# Patient Record
Sex: Female | Born: 1935
Health system: Southern US, Community
[De-identification: ages and names within clinical notes are randomized; demographics above are authoritative.]

## PROBLEM LIST (undated history)

## (undated) DIAGNOSIS — M109 Gout, unspecified: Secondary | ICD-10-CM

## (undated) DIAGNOSIS — I350 Nonrheumatic aortic (valve) stenosis: Secondary | ICD-10-CM

## (undated) DIAGNOSIS — I4891 Unspecified atrial fibrillation: Secondary | ICD-10-CM

## (undated) DIAGNOSIS — I10 Essential (primary) hypertension: Secondary | ICD-10-CM

## (undated) DIAGNOSIS — E079 Disorder of thyroid, unspecified: Secondary | ICD-10-CM

## (undated) DIAGNOSIS — E7800 Pure hypercholesterolemia, unspecified: Secondary | ICD-10-CM

## (undated) DIAGNOSIS — R011 Cardiac murmur, unspecified: Secondary | ICD-10-CM

## (undated) HISTORY — PX: APPENDECTOMY: SHX54

## (undated) HISTORY — DX: Nonrheumatic aortic (valve) stenosis: I35.0

---

## 2014-08-10 DIAGNOSIS — Z961 Presence of intraocular lens: Secondary | ICD-10-CM | POA: Diagnosis not present

## 2014-08-10 DIAGNOSIS — H02833 Dermatochalasis of right eye, unspecified eyelid: Secondary | ICD-10-CM | POA: Diagnosis not present

## 2014-08-10 DIAGNOSIS — H3531 Nonexudative age-related macular degeneration: Secondary | ICD-10-CM | POA: Diagnosis not present

## 2014-08-14 DIAGNOSIS — H547 Unspecified visual loss: Secondary | ICD-10-CM | POA: Diagnosis not present

## 2014-08-16 DIAGNOSIS — R030 Elevated blood-pressure reading, without diagnosis of hypertension: Secondary | ICD-10-CM | POA: Diagnosis not present

## 2014-08-16 DIAGNOSIS — M109 Gout, unspecified: Secondary | ICD-10-CM | POA: Diagnosis not present

## 2014-08-16 DIAGNOSIS — R200 Anesthesia of skin: Secondary | ICD-10-CM | POA: Diagnosis not present

## 2014-08-16 DIAGNOSIS — H3530 Unspecified macular degeneration: Secondary | ICD-10-CM | POA: Diagnosis not present

## 2014-08-16 DIAGNOSIS — M159 Polyosteoarthritis, unspecified: Secondary | ICD-10-CM | POA: Diagnosis not present

## 2014-08-16 DIAGNOSIS — N182 Chronic kidney disease, stage 2 (mild): Secondary | ICD-10-CM | POA: Diagnosis not present

## 2014-08-16 DIAGNOSIS — R609 Edema, unspecified: Secondary | ICD-10-CM | POA: Diagnosis not present

## 2014-08-16 DIAGNOSIS — H9190 Unspecified hearing loss, unspecified ear: Secondary | ICD-10-CM | POA: Diagnosis not present

## 2014-08-16 DIAGNOSIS — H547 Unspecified visual loss: Secondary | ICD-10-CM | POA: Diagnosis not present

## 2014-08-19 DIAGNOSIS — R200 Anesthesia of skin: Secondary | ICD-10-CM | POA: Diagnosis not present

## 2014-08-19 DIAGNOSIS — N182 Chronic kidney disease, stage 2 (mild): Secondary | ICD-10-CM | POA: Diagnosis not present

## 2014-08-19 DIAGNOSIS — R030 Elevated blood-pressure reading, without diagnosis of hypertension: Secondary | ICD-10-CM | POA: Diagnosis not present

## 2014-08-19 DIAGNOSIS — H547 Unspecified visual loss: Secondary | ICD-10-CM | POA: Diagnosis not present

## 2014-08-19 DIAGNOSIS — M159 Polyosteoarthritis, unspecified: Secondary | ICD-10-CM | POA: Diagnosis not present

## 2014-08-19 DIAGNOSIS — R609 Edema, unspecified: Secondary | ICD-10-CM | POA: Diagnosis not present

## 2014-08-26 DIAGNOSIS — N182 Chronic kidney disease, stage 2 (mild): Secondary | ICD-10-CM | POA: Diagnosis not present

## 2014-08-26 DIAGNOSIS — H547 Unspecified visual loss: Secondary | ICD-10-CM | POA: Diagnosis not present

## 2014-08-26 DIAGNOSIS — R609 Edema, unspecified: Secondary | ICD-10-CM | POA: Diagnosis not present

## 2014-08-26 DIAGNOSIS — M159 Polyosteoarthritis, unspecified: Secondary | ICD-10-CM | POA: Diagnosis not present

## 2014-08-26 DIAGNOSIS — R030 Elevated blood-pressure reading, without diagnosis of hypertension: Secondary | ICD-10-CM | POA: Diagnosis not present

## 2014-08-26 DIAGNOSIS — R200 Anesthesia of skin: Secondary | ICD-10-CM | POA: Diagnosis not present

## 2014-08-27 DIAGNOSIS — M159 Polyosteoarthritis, unspecified: Secondary | ICD-10-CM | POA: Diagnosis not present

## 2014-10-08 DIAGNOSIS — E785 Hyperlipidemia, unspecified: Secondary | ICD-10-CM | POA: Diagnosis not present

## 2014-10-08 DIAGNOSIS — J40 Bronchitis, not specified as acute or chronic: Secondary | ICD-10-CM | POA: Diagnosis not present

## 2014-10-08 DIAGNOSIS — R7301 Impaired fasting glucose: Secondary | ICD-10-CM | POA: Diagnosis not present

## 2014-10-08 DIAGNOSIS — I10 Essential (primary) hypertension: Secondary | ICD-10-CM | POA: Diagnosis not present

## 2014-10-20 DIAGNOSIS — R21 Rash and other nonspecific skin eruption: Secondary | ICD-10-CM | POA: Diagnosis not present

## 2014-11-07 DIAGNOSIS — I10 Essential (primary) hypertension: Secondary | ICD-10-CM | POA: Diagnosis not present

## 2014-11-07 DIAGNOSIS — J40 Bronchitis, not specified as acute or chronic: Secondary | ICD-10-CM | POA: Diagnosis not present

## 2014-11-07 DIAGNOSIS — R7301 Impaired fasting glucose: Secondary | ICD-10-CM | POA: Diagnosis not present

## 2014-11-07 DIAGNOSIS — E785 Hyperlipidemia, unspecified: Secondary | ICD-10-CM | POA: Diagnosis not present

## 2014-12-19 DIAGNOSIS — H3531 Nonexudative age-related macular degeneration: Secondary | ICD-10-CM | POA: Diagnosis not present

## 2014-12-19 DIAGNOSIS — Z961 Presence of intraocular lens: Secondary | ICD-10-CM | POA: Diagnosis not present

## 2014-12-31 DIAGNOSIS — M79605 Pain in left leg: Secondary | ICD-10-CM | POA: Diagnosis not present

## 2014-12-31 DIAGNOSIS — S81802A Unspecified open wound, left lower leg, initial encounter: Secondary | ICD-10-CM | POA: Diagnosis not present

## 2014-12-31 DIAGNOSIS — Z23 Encounter for immunization: Secondary | ICD-10-CM | POA: Diagnosis not present

## 2014-12-31 DIAGNOSIS — S81801A Unspecified open wound, right lower leg, initial encounter: Secondary | ICD-10-CM | POA: Diagnosis not present

## 2015-01-04 DIAGNOSIS — N182 Chronic kidney disease, stage 2 (mild): Secondary | ICD-10-CM | POA: Diagnosis not present

## 2015-01-04 DIAGNOSIS — S81801D Unspecified open wound, right lower leg, subsequent encounter: Secondary | ICD-10-CM | POA: Diagnosis not present

## 2015-01-04 DIAGNOSIS — R609 Edema, unspecified: Secondary | ICD-10-CM | POA: Diagnosis not present

## 2015-01-04 DIAGNOSIS — B9562 Methicillin resistant Staphylococcus aureus infection as the cause of diseases classified elsewhere: Secondary | ICD-10-CM | POA: Diagnosis not present

## 2015-01-04 DIAGNOSIS — M79605 Pain in left leg: Secondary | ICD-10-CM | POA: Diagnosis not present

## 2015-01-04 DIAGNOSIS — M109 Gout, unspecified: Secondary | ICD-10-CM | POA: Diagnosis not present

## 2015-01-04 DIAGNOSIS — S81802D Unspecified open wound, left lower leg, subsequent encounter: Secondary | ICD-10-CM | POA: Diagnosis not present

## 2015-01-07 DIAGNOSIS — M79605 Pain in left leg: Secondary | ICD-10-CM | POA: Diagnosis not present

## 2015-01-07 DIAGNOSIS — N182 Chronic kidney disease, stage 2 (mild): Secondary | ICD-10-CM | POA: Diagnosis not present

## 2015-01-07 DIAGNOSIS — S81802D Unspecified open wound, left lower leg, subsequent encounter: Secondary | ICD-10-CM | POA: Diagnosis not present

## 2015-01-07 DIAGNOSIS — B9562 Methicillin resistant Staphylococcus aureus infection as the cause of diseases classified elsewhere: Secondary | ICD-10-CM | POA: Diagnosis not present

## 2015-01-07 DIAGNOSIS — R609 Edema, unspecified: Secondary | ICD-10-CM | POA: Diagnosis not present

## 2015-01-07 DIAGNOSIS — S81801D Unspecified open wound, right lower leg, subsequent encounter: Secondary | ICD-10-CM | POA: Diagnosis not present

## 2015-01-08 DIAGNOSIS — A4902 Methicillin resistant Staphylococcus aureus infection, unspecified site: Secondary | ICD-10-CM | POA: Diagnosis not present

## 2015-01-09 DIAGNOSIS — S81801D Unspecified open wound, right lower leg, subsequent encounter: Secondary | ICD-10-CM | POA: Diagnosis not present

## 2015-01-09 DIAGNOSIS — M79605 Pain in left leg: Secondary | ICD-10-CM | POA: Diagnosis not present

## 2015-01-09 DIAGNOSIS — B9562 Methicillin resistant Staphylococcus aureus infection as the cause of diseases classified elsewhere: Secondary | ICD-10-CM | POA: Diagnosis not present

## 2015-01-09 DIAGNOSIS — R609 Edema, unspecified: Secondary | ICD-10-CM | POA: Diagnosis not present

## 2015-01-09 DIAGNOSIS — N182 Chronic kidney disease, stage 2 (mild): Secondary | ICD-10-CM | POA: Diagnosis not present

## 2015-01-09 DIAGNOSIS — S81802D Unspecified open wound, left lower leg, subsequent encounter: Secondary | ICD-10-CM | POA: Diagnosis not present

## 2015-01-12 DIAGNOSIS — R609 Edema, unspecified: Secondary | ICD-10-CM | POA: Diagnosis not present

## 2015-01-12 DIAGNOSIS — B9562 Methicillin resistant Staphylococcus aureus infection as the cause of diseases classified elsewhere: Secondary | ICD-10-CM | POA: Diagnosis not present

## 2015-01-12 DIAGNOSIS — L0390 Cellulitis, unspecified: Secondary | ICD-10-CM | POA: Diagnosis not present

## 2015-01-12 DIAGNOSIS — M79605 Pain in left leg: Secondary | ICD-10-CM | POA: Diagnosis not present

## 2015-01-12 DIAGNOSIS — S81802D Unspecified open wound, left lower leg, subsequent encounter: Secondary | ICD-10-CM | POA: Diagnosis not present

## 2015-01-12 DIAGNOSIS — S81801D Unspecified open wound, right lower leg, subsequent encounter: Secondary | ICD-10-CM | POA: Diagnosis not present

## 2015-01-12 DIAGNOSIS — N182 Chronic kidney disease, stage 2 (mild): Secondary | ICD-10-CM | POA: Diagnosis not present

## 2015-01-16 DIAGNOSIS — M79605 Pain in left leg: Secondary | ICD-10-CM | POA: Diagnosis not present

## 2015-01-16 DIAGNOSIS — R609 Edema, unspecified: Secondary | ICD-10-CM | POA: Diagnosis not present

## 2015-01-16 DIAGNOSIS — S81801D Unspecified open wound, right lower leg, subsequent encounter: Secondary | ICD-10-CM | POA: Diagnosis not present

## 2015-01-16 DIAGNOSIS — B9562 Methicillin resistant Staphylococcus aureus infection as the cause of diseases classified elsewhere: Secondary | ICD-10-CM | POA: Diagnosis not present

## 2015-01-16 DIAGNOSIS — S81802D Unspecified open wound, left lower leg, subsequent encounter: Secondary | ICD-10-CM | POA: Diagnosis not present

## 2015-01-16 DIAGNOSIS — N182 Chronic kidney disease, stage 2 (mild): Secondary | ICD-10-CM | POA: Diagnosis not present

## 2015-01-19 DIAGNOSIS — S81802D Unspecified open wound, left lower leg, subsequent encounter: Secondary | ICD-10-CM | POA: Diagnosis not present

## 2015-01-19 DIAGNOSIS — S81801D Unspecified open wound, right lower leg, subsequent encounter: Secondary | ICD-10-CM | POA: Diagnosis not present

## 2015-01-19 DIAGNOSIS — R609 Edema, unspecified: Secondary | ICD-10-CM | POA: Diagnosis not present

## 2015-01-19 DIAGNOSIS — M79605 Pain in left leg: Secondary | ICD-10-CM | POA: Diagnosis not present

## 2015-01-19 DIAGNOSIS — B9562 Methicillin resistant Staphylococcus aureus infection as the cause of diseases classified elsewhere: Secondary | ICD-10-CM | POA: Diagnosis not present

## 2015-01-19 DIAGNOSIS — N182 Chronic kidney disease, stage 2 (mild): Secondary | ICD-10-CM | POA: Diagnosis not present

## 2015-01-20 DIAGNOSIS — I4510 Unspecified right bundle-branch block: Secondary | ICD-10-CM | POA: Diagnosis not present

## 2015-01-20 DIAGNOSIS — E785 Hyperlipidemia, unspecified: Secondary | ICD-10-CM | POA: Diagnosis not present

## 2015-01-20 DIAGNOSIS — I10 Essential (primary) hypertension: Secondary | ICD-10-CM | POA: Diagnosis not present

## 2015-01-20 DIAGNOSIS — H8110 Benign paroxysmal vertigo, unspecified ear: Secondary | ICD-10-CM | POA: Diagnosis not present

## 2015-01-20 DIAGNOSIS — H81399 Other peripheral vertigo, unspecified ear: Secondary | ICD-10-CM | POA: Diagnosis not present

## 2015-01-21 DIAGNOSIS — B9562 Methicillin resistant Staphylococcus aureus infection as the cause of diseases classified elsewhere: Secondary | ICD-10-CM | POA: Diagnosis not present

## 2015-01-21 DIAGNOSIS — N182 Chronic kidney disease, stage 2 (mild): Secondary | ICD-10-CM | POA: Diagnosis not present

## 2015-01-21 DIAGNOSIS — R609 Edema, unspecified: Secondary | ICD-10-CM | POA: Diagnosis not present

## 2015-01-21 DIAGNOSIS — S81801D Unspecified open wound, right lower leg, subsequent encounter: Secondary | ICD-10-CM | POA: Diagnosis not present

## 2015-01-21 DIAGNOSIS — H8110 Benign paroxysmal vertigo, unspecified ear: Secondary | ICD-10-CM | POA: Diagnosis not present

## 2015-01-21 DIAGNOSIS — M79605 Pain in left leg: Secondary | ICD-10-CM | POA: Diagnosis not present

## 2015-01-21 DIAGNOSIS — S81802D Unspecified open wound, left lower leg, subsequent encounter: Secondary | ICD-10-CM | POA: Diagnosis not present

## 2015-01-26 DIAGNOSIS — S81801D Unspecified open wound, right lower leg, subsequent encounter: Secondary | ICD-10-CM | POA: Diagnosis not present

## 2015-01-26 DIAGNOSIS — B9562 Methicillin resistant Staphylococcus aureus infection as the cause of diseases classified elsewhere: Secondary | ICD-10-CM | POA: Diagnosis not present

## 2015-01-26 DIAGNOSIS — N182 Chronic kidney disease, stage 2 (mild): Secondary | ICD-10-CM | POA: Diagnosis not present

## 2015-01-26 DIAGNOSIS — M79605 Pain in left leg: Secondary | ICD-10-CM | POA: Diagnosis not present

## 2015-01-26 DIAGNOSIS — S81802D Unspecified open wound, left lower leg, subsequent encounter: Secondary | ICD-10-CM | POA: Diagnosis not present

## 2015-01-26 DIAGNOSIS — R609 Edema, unspecified: Secondary | ICD-10-CM | POA: Diagnosis not present

## 2015-01-27 DIAGNOSIS — S81802D Unspecified open wound, left lower leg, subsequent encounter: Secondary | ICD-10-CM | POA: Diagnosis not present

## 2015-01-27 DIAGNOSIS — B9562 Methicillin resistant Staphylococcus aureus infection as the cause of diseases classified elsewhere: Secondary | ICD-10-CM | POA: Diagnosis not present

## 2015-01-27 DIAGNOSIS — N182 Chronic kidney disease, stage 2 (mild): Secondary | ICD-10-CM | POA: Diagnosis not present

## 2015-01-27 DIAGNOSIS — S81801D Unspecified open wound, right lower leg, subsequent encounter: Secondary | ICD-10-CM | POA: Diagnosis not present

## 2015-01-27 DIAGNOSIS — M79605 Pain in left leg: Secondary | ICD-10-CM | POA: Diagnosis not present

## 2015-01-27 DIAGNOSIS — R609 Edema, unspecified: Secondary | ICD-10-CM | POA: Diagnosis not present

## 2015-01-30 DIAGNOSIS — B9562 Methicillin resistant Staphylococcus aureus infection as the cause of diseases classified elsewhere: Secondary | ICD-10-CM | POA: Diagnosis not present

## 2015-01-30 DIAGNOSIS — M79605 Pain in left leg: Secondary | ICD-10-CM | POA: Diagnosis not present

## 2015-01-30 DIAGNOSIS — S81801D Unspecified open wound, right lower leg, subsequent encounter: Secondary | ICD-10-CM | POA: Diagnosis not present

## 2015-01-30 DIAGNOSIS — S81802D Unspecified open wound, left lower leg, subsequent encounter: Secondary | ICD-10-CM | POA: Diagnosis not present

## 2015-01-30 DIAGNOSIS — R609 Edema, unspecified: Secondary | ICD-10-CM | POA: Diagnosis not present

## 2015-01-30 DIAGNOSIS — N182 Chronic kidney disease, stage 2 (mild): Secondary | ICD-10-CM | POA: Diagnosis not present

## 2015-02-11 DIAGNOSIS — S81801D Unspecified open wound, right lower leg, subsequent encounter: Secondary | ICD-10-CM | POA: Diagnosis not present

## 2015-02-11 DIAGNOSIS — B9562 Methicillin resistant Staphylococcus aureus infection as the cause of diseases classified elsewhere: Secondary | ICD-10-CM | POA: Diagnosis not present

## 2015-02-11 DIAGNOSIS — S81802D Unspecified open wound, left lower leg, subsequent encounter: Secondary | ICD-10-CM | POA: Diagnosis not present

## 2015-02-11 DIAGNOSIS — R609 Edema, unspecified: Secondary | ICD-10-CM | POA: Diagnosis not present

## 2015-02-11 DIAGNOSIS — M79605 Pain in left leg: Secondary | ICD-10-CM | POA: Diagnosis not present

## 2015-02-11 DIAGNOSIS — N182 Chronic kidney disease, stage 2 (mild): Secondary | ICD-10-CM | POA: Diagnosis not present

## 2015-03-02 DIAGNOSIS — S81802D Unspecified open wound, left lower leg, subsequent encounter: Secondary | ICD-10-CM | POA: Diagnosis not present

## 2015-03-06 DIAGNOSIS — S81802D Unspecified open wound, left lower leg, subsequent encounter: Secondary | ICD-10-CM | POA: Diagnosis not present

## 2015-04-27 DIAGNOSIS — E785 Hyperlipidemia, unspecified: Secondary | ICD-10-CM | POA: Diagnosis not present

## 2015-04-27 DIAGNOSIS — R609 Edema, unspecified: Secondary | ICD-10-CM | POA: Diagnosis not present

## 2015-04-27 DIAGNOSIS — Z23 Encounter for immunization: Secondary | ICD-10-CM | POA: Diagnosis not present

## 2015-04-27 DIAGNOSIS — Z0000 Encounter for general adult medical examination without abnormal findings: Secondary | ICD-10-CM | POA: Diagnosis not present

## 2015-04-27 DIAGNOSIS — R7301 Impaired fasting glucose: Secondary | ICD-10-CM | POA: Diagnosis not present

## 2015-04-27 DIAGNOSIS — I10 Essential (primary) hypertension: Secondary | ICD-10-CM | POA: Diagnosis not present

## 2015-04-27 DIAGNOSIS — S81801A Unspecified open wound, right lower leg, initial encounter: Secondary | ICD-10-CM | POA: Diagnosis not present

## 2015-04-27 DIAGNOSIS — M79605 Pain in left leg: Secondary | ICD-10-CM | POA: Diagnosis not present

## 2015-04-27 DIAGNOSIS — S81802A Unspecified open wound, left lower leg, initial encounter: Secondary | ICD-10-CM | POA: Diagnosis not present

## 2015-04-27 DIAGNOSIS — M109 Gout, unspecified: Secondary | ICD-10-CM | POA: Diagnosis not present

## 2015-05-25 DIAGNOSIS — F0390 Unspecified dementia without behavioral disturbance: Secondary | ICD-10-CM | POA: Diagnosis not present

## 2015-05-25 DIAGNOSIS — F329 Major depressive disorder, single episode, unspecified: Secondary | ICD-10-CM | POA: Diagnosis not present

## 2015-05-25 DIAGNOSIS — G3184 Mild cognitive impairment, so stated: Secondary | ICD-10-CM | POA: Diagnosis not present

## 2015-05-25 DIAGNOSIS — R400 Somnolence: Secondary | ICD-10-CM | POA: Diagnosis not present

## 2015-05-25 DIAGNOSIS — F430 Acute stress reaction: Secondary | ICD-10-CM | POA: Diagnosis not present

## 2015-05-25 DIAGNOSIS — E785 Hyperlipidemia, unspecified: Secondary | ICD-10-CM | POA: Diagnosis not present

## 2015-05-25 DIAGNOSIS — N390 Urinary tract infection, site not specified: Secondary | ICD-10-CM | POA: Diagnosis not present

## 2015-05-25 DIAGNOSIS — Z66 Do not resuscitate: Secondary | ICD-10-CM | POA: Diagnosis not present

## 2015-05-25 DIAGNOSIS — I10 Essential (primary) hypertension: Secondary | ICD-10-CM | POA: Diagnosis not present

## 2015-05-25 DIAGNOSIS — R4182 Altered mental status, unspecified: Secondary | ICD-10-CM | POA: Diagnosis not present

## 2015-05-25 DIAGNOSIS — R011 Cardiac murmur, unspecified: Secondary | ICD-10-CM | POA: Diagnosis not present

## 2015-05-25 DIAGNOSIS — E876 Hypokalemia: Secondary | ICD-10-CM | POA: Diagnosis not present

## 2015-06-08 DIAGNOSIS — L578 Other skin changes due to chronic exposure to nonionizing radiation: Secondary | ICD-10-CM | POA: Diagnosis not present

## 2015-06-08 DIAGNOSIS — C44712 Basal cell carcinoma of skin of right lower limb, including hip: Secondary | ICD-10-CM | POA: Diagnosis not present

## 2015-06-08 DIAGNOSIS — L570 Actinic keratosis: Secondary | ICD-10-CM | POA: Diagnosis not present

## 2015-06-08 DIAGNOSIS — D0471 Carcinoma in situ of skin of right lower limb, including hip: Secondary | ICD-10-CM | POA: Diagnosis not present

## 2015-06-08 DIAGNOSIS — C44719 Basal cell carcinoma of skin of left lower limb, including hip: Secondary | ICD-10-CM | POA: Diagnosis not present

## 2015-06-08 DIAGNOSIS — D485 Neoplasm of uncertain behavior of skin: Secondary | ICD-10-CM | POA: Diagnosis not present

## 2015-06-08 DIAGNOSIS — Z85828 Personal history of other malignant neoplasm of skin: Secondary | ICD-10-CM | POA: Diagnosis not present

## 2015-06-09 DIAGNOSIS — C44719 Basal cell carcinoma of skin of left lower limb, including hip: Secondary | ICD-10-CM | POA: Diagnosis not present

## 2015-06-09 DIAGNOSIS — L570 Actinic keratosis: Secondary | ICD-10-CM | POA: Diagnosis not present

## 2015-06-09 DIAGNOSIS — D0471 Carcinoma in situ of skin of right lower limb, including hip: Secondary | ICD-10-CM | POA: Diagnosis not present

## 2015-06-09 DIAGNOSIS — C44712 Basal cell carcinoma of skin of right lower limb, including hip: Secondary | ICD-10-CM | POA: Diagnosis not present

## 2015-06-23 DIAGNOSIS — Z85828 Personal history of other malignant neoplasm of skin: Secondary | ICD-10-CM | POA: Diagnosis not present

## 2015-06-23 DIAGNOSIS — L570 Actinic keratosis: Secondary | ICD-10-CM | POA: Diagnosis not present

## 2015-06-23 DIAGNOSIS — D0471 Carcinoma in situ of skin of right lower limb, including hip: Secondary | ICD-10-CM | POA: Diagnosis not present

## 2015-06-23 DIAGNOSIS — C44719 Basal cell carcinoma of skin of left lower limb, including hip: Secondary | ICD-10-CM | POA: Diagnosis not present

## 2015-06-23 DIAGNOSIS — C44712 Basal cell carcinoma of skin of right lower limb, including hip: Secondary | ICD-10-CM | POA: Diagnosis not present

## 2015-07-14 DIAGNOSIS — F329 Major depressive disorder, single episode, unspecified: Secondary | ICD-10-CM | POA: Diagnosis not present

## 2015-07-14 DIAGNOSIS — C439 Malignant melanoma of skin, unspecified: Secondary | ICD-10-CM | POA: Diagnosis not present

## 2015-07-14 DIAGNOSIS — I10 Essential (primary) hypertension: Secondary | ICD-10-CM | POA: Diagnosis not present

## 2015-07-14 DIAGNOSIS — E785 Hyperlipidemia, unspecified: Secondary | ICD-10-CM | POA: Diagnosis not present

## 2015-07-16 DIAGNOSIS — I10 Essential (primary) hypertension: Secondary | ICD-10-CM | POA: Diagnosis not present

## 2015-07-16 DIAGNOSIS — E785 Hyperlipidemia, unspecified: Secondary | ICD-10-CM | POA: Diagnosis not present

## 2015-07-16 DIAGNOSIS — E559 Vitamin D deficiency, unspecified: Secondary | ICD-10-CM | POA: Diagnosis not present

## 2015-07-28 DIAGNOSIS — E559 Vitamin D deficiency, unspecified: Secondary | ICD-10-CM | POA: Diagnosis not present

## 2015-07-28 DIAGNOSIS — F329 Major depressive disorder, single episode, unspecified: Secondary | ICD-10-CM | POA: Diagnosis not present

## 2015-07-28 DIAGNOSIS — D649 Anemia, unspecified: Secondary | ICD-10-CM | POA: Diagnosis not present

## 2015-07-28 DIAGNOSIS — E785 Hyperlipidemia, unspecified: Secondary | ICD-10-CM | POA: Diagnosis not present

## 2015-08-25 DIAGNOSIS — L570 Actinic keratosis: Secondary | ICD-10-CM | POA: Diagnosis not present

## 2015-08-25 DIAGNOSIS — X32XXXA Exposure to sunlight, initial encounter: Secondary | ICD-10-CM | POA: Diagnosis not present

## 2015-08-25 DIAGNOSIS — L0100 Impetigo, unspecified: Secondary | ICD-10-CM | POA: Diagnosis not present

## 2015-08-25 DIAGNOSIS — D225 Melanocytic nevi of trunk: Secondary | ICD-10-CM | POA: Diagnosis not present

## 2015-09-04 DIAGNOSIS — H353120 Nonexudative age-related macular degeneration, left eye, stage unspecified: Secondary | ICD-10-CM | POA: Diagnosis not present

## 2015-09-04 DIAGNOSIS — H353110 Nonexudative age-related macular degeneration, right eye, stage unspecified: Secondary | ICD-10-CM | POA: Diagnosis not present

## 2015-09-04 DIAGNOSIS — H34211 Partial retinal artery occlusion, right eye: Secondary | ICD-10-CM | POA: Diagnosis not present

## 2015-09-04 DIAGNOSIS — Z961 Presence of intraocular lens: Secondary | ICD-10-CM | POA: Diagnosis not present

## 2015-09-08 DIAGNOSIS — E039 Hypothyroidism, unspecified: Secondary | ICD-10-CM | POA: Diagnosis not present

## 2015-09-08 DIAGNOSIS — D0339 Melanoma in situ of other parts of face: Secondary | ICD-10-CM | POA: Diagnosis not present

## 2015-09-08 DIAGNOSIS — K5900 Constipation, unspecified: Secondary | ICD-10-CM | POA: Diagnosis not present

## 2015-09-09 DIAGNOSIS — L0100 Impetigo, unspecified: Secondary | ICD-10-CM | POA: Diagnosis not present

## 2015-09-09 DIAGNOSIS — X32XXXD Exposure to sunlight, subsequent encounter: Secondary | ICD-10-CM | POA: Diagnosis not present

## 2015-09-09 DIAGNOSIS — L570 Actinic keratosis: Secondary | ICD-10-CM | POA: Diagnosis not present

## 2015-09-30 DIAGNOSIS — L570 Actinic keratosis: Secondary | ICD-10-CM | POA: Diagnosis not present

## 2015-09-30 DIAGNOSIS — X32XXXD Exposure to sunlight, subsequent encounter: Secondary | ICD-10-CM | POA: Diagnosis not present

## 2015-09-30 DIAGNOSIS — C44319 Basal cell carcinoma of skin of other parts of face: Secondary | ICD-10-CM | POA: Diagnosis not present

## 2015-10-22 DIAGNOSIS — E039 Hypothyroidism, unspecified: Secondary | ICD-10-CM | POA: Diagnosis not present

## 2015-10-22 DIAGNOSIS — I10 Essential (primary) hypertension: Secondary | ICD-10-CM | POA: Diagnosis not present

## 2015-10-22 DIAGNOSIS — H6123 Impacted cerumen, bilateral: Secondary | ICD-10-CM | POA: Diagnosis not present

## 2015-12-22 DIAGNOSIS — X32XXXD Exposure to sunlight, subsequent encounter: Secondary | ICD-10-CM | POA: Diagnosis not present

## 2015-12-22 DIAGNOSIS — L570 Actinic keratosis: Secondary | ICD-10-CM | POA: Diagnosis not present

## 2015-12-22 DIAGNOSIS — Z85828 Personal history of other malignant neoplasm of skin: Secondary | ICD-10-CM | POA: Diagnosis not present

## 2015-12-22 DIAGNOSIS — C44319 Basal cell carcinoma of skin of other parts of face: Secondary | ICD-10-CM | POA: Diagnosis not present

## 2015-12-22 DIAGNOSIS — Z08 Encounter for follow-up examination after completed treatment for malignant neoplasm: Secondary | ICD-10-CM | POA: Diagnosis not present

## 2016-01-22 DIAGNOSIS — E039 Hypothyroidism, unspecified: Secondary | ICD-10-CM | POA: Diagnosis not present

## 2016-01-22 DIAGNOSIS — E559 Vitamin D deficiency, unspecified: Secondary | ICD-10-CM | POA: Diagnosis not present

## 2016-01-22 DIAGNOSIS — Z0000 Encounter for general adult medical examination without abnormal findings: Secondary | ICD-10-CM | POA: Diagnosis not present

## 2016-01-22 DIAGNOSIS — I10 Essential (primary) hypertension: Secondary | ICD-10-CM | POA: Diagnosis not present

## 2016-01-22 DIAGNOSIS — D649 Anemia, unspecified: Secondary | ICD-10-CM | POA: Diagnosis not present

## 2016-02-02 DIAGNOSIS — E559 Vitamin D deficiency, unspecified: Secondary | ICD-10-CM | POA: Diagnosis not present

## 2016-02-02 DIAGNOSIS — I10 Essential (primary) hypertension: Secondary | ICD-10-CM | POA: Diagnosis not present

## 2016-02-02 DIAGNOSIS — H6123 Impacted cerumen, bilateral: Secondary | ICD-10-CM | POA: Diagnosis not present

## 2016-02-02 DIAGNOSIS — D649 Anemia, unspecified: Secondary | ICD-10-CM | POA: Diagnosis not present

## 2016-02-02 DIAGNOSIS — E039 Hypothyroidism, unspecified: Secondary | ICD-10-CM | POA: Diagnosis not present

## 2016-02-02 DIAGNOSIS — N390 Urinary tract infection, site not specified: Secondary | ICD-10-CM | POA: Diagnosis not present

## 2016-02-02 DIAGNOSIS — K559 Vascular disorder of intestine, unspecified: Secondary | ICD-10-CM | POA: Diagnosis not present

## 2016-02-11 DIAGNOSIS — R197 Diarrhea, unspecified: Secondary | ICD-10-CM | POA: Diagnosis not present

## 2016-03-09 DIAGNOSIS — I10 Essential (primary) hypertension: Secondary | ICD-10-CM | POA: Diagnosis not present

## 2016-03-09 DIAGNOSIS — R0989 Other specified symptoms and signs involving the circulatory and respiratory systems: Secondary | ICD-10-CM | POA: Diagnosis not present

## 2016-03-09 DIAGNOSIS — I350 Nonrheumatic aortic (valve) stenosis: Secondary | ICD-10-CM | POA: Diagnosis not present

## 2016-03-09 DIAGNOSIS — E7800 Pure hypercholesterolemia, unspecified: Secondary | ICD-10-CM | POA: Diagnosis not present

## 2016-03-15 DIAGNOSIS — I10 Essential (primary) hypertension: Secondary | ICD-10-CM | POA: Diagnosis not present

## 2016-03-15 DIAGNOSIS — E2839 Other primary ovarian failure: Secondary | ICD-10-CM | POA: Diagnosis not present

## 2016-03-15 DIAGNOSIS — E039 Hypothyroidism, unspecified: Secondary | ICD-10-CM | POA: Diagnosis not present

## 2016-03-15 DIAGNOSIS — D649 Anemia, unspecified: Secondary | ICD-10-CM | POA: Diagnosis not present

## 2016-08-12 DIAGNOSIS — I10 Essential (primary) hypertension: Secondary | ICD-10-CM | POA: Diagnosis not present

## 2016-08-12 DIAGNOSIS — D649 Anemia, unspecified: Secondary | ICD-10-CM | POA: Diagnosis not present

## 2016-08-12 DIAGNOSIS — E538 Deficiency of other specified B group vitamins: Secondary | ICD-10-CM | POA: Diagnosis not present

## 2016-08-12 DIAGNOSIS — D509 Iron deficiency anemia, unspecified: Secondary | ICD-10-CM | POA: Diagnosis not present

## 2016-08-12 DIAGNOSIS — Z79899 Other long term (current) drug therapy: Secondary | ICD-10-CM | POA: Diagnosis not present

## 2016-08-15 DIAGNOSIS — W19XXXA Unspecified fall, initial encounter: Secondary | ICD-10-CM | POA: Diagnosis not present

## 2016-08-15 DIAGNOSIS — S0181XA Laceration without foreign body of other part of head, initial encounter: Secondary | ICD-10-CM | POA: Diagnosis not present

## 2016-08-15 DIAGNOSIS — I509 Heart failure, unspecified: Secondary | ICD-10-CM | POA: Diagnosis not present

## 2016-08-22 DIAGNOSIS — K219 Gastro-esophageal reflux disease without esophagitis: Secondary | ICD-10-CM | POA: Diagnosis not present

## 2016-08-22 DIAGNOSIS — Z0000 Encounter for general adult medical examination without abnormal findings: Secondary | ICD-10-CM | POA: Diagnosis not present

## 2016-08-22 DIAGNOSIS — I10 Essential (primary) hypertension: Secondary | ICD-10-CM | POA: Diagnosis not present

## 2016-08-22 DIAGNOSIS — E7800 Pure hypercholesterolemia, unspecified: Secondary | ICD-10-CM | POA: Diagnosis not present

## 2016-08-22 DIAGNOSIS — E039 Hypothyroidism, unspecified: Secondary | ICD-10-CM | POA: Diagnosis not present

## 2016-09-16 DIAGNOSIS — C44622 Squamous cell carcinoma of skin of right upper limb, including shoulder: Secondary | ICD-10-CM | POA: Diagnosis not present

## 2016-10-21 DIAGNOSIS — Z08 Encounter for follow-up examination after completed treatment for malignant neoplasm: Secondary | ICD-10-CM | POA: Diagnosis not present

## 2016-10-21 DIAGNOSIS — Z85828 Personal history of other malignant neoplasm of skin: Secondary | ICD-10-CM | POA: Diagnosis not present

## 2017-01-17 DIAGNOSIS — R05 Cough: Secondary | ICD-10-CM | POA: Diagnosis not present

## 2017-01-17 DIAGNOSIS — E7800 Pure hypercholesterolemia, unspecified: Secondary | ICD-10-CM | POA: Diagnosis not present

## 2017-01-17 DIAGNOSIS — I10 Essential (primary) hypertension: Secondary | ICD-10-CM | POA: Diagnosis not present

## 2017-02-24 DIAGNOSIS — E7800 Pure hypercholesterolemia, unspecified: Secondary | ICD-10-CM | POA: Diagnosis not present

## 2017-03-23 DIAGNOSIS — I10 Essential (primary) hypertension: Secondary | ICD-10-CM | POA: Diagnosis not present

## 2017-03-23 DIAGNOSIS — E7800 Pure hypercholesterolemia, unspecified: Secondary | ICD-10-CM | POA: Diagnosis not present

## 2017-03-23 DIAGNOSIS — E039 Hypothyroidism, unspecified: Secondary | ICD-10-CM | POA: Diagnosis not present

## 2017-03-23 DIAGNOSIS — R011 Cardiac murmur, unspecified: Secondary | ICD-10-CM | POA: Diagnosis not present

## 2017-05-05 ENCOUNTER — Other Ambulatory Visit: Payer: Self-pay

## 2017-05-05 ENCOUNTER — Emergency Department (HOSPITAL_COMMUNITY): Payer: Medicare Other

## 2017-05-05 ENCOUNTER — Encounter (HOSPITAL_COMMUNITY): Payer: Self-pay

## 2017-05-05 ENCOUNTER — Emergency Department (HOSPITAL_COMMUNITY)
Admission: EM | Admit: 2017-05-05 | Discharge: 2017-05-06 | Disposition: A | Discharge status: Home / Self Care | Payer: Medicare Other | Attending: Emergency Medicine | Admitting: Emergency Medicine

## 2017-05-05 DIAGNOSIS — R1013 Epigastric pain: Secondary | ICD-10-CM | POA: Insufficient documentation

## 2017-05-05 DIAGNOSIS — R1111 Vomiting without nausea: Secondary | ICD-10-CM | POA: Diagnosis not present

## 2017-05-05 DIAGNOSIS — R109 Unspecified abdominal pain: Secondary | ICD-10-CM

## 2017-05-05 DIAGNOSIS — R079 Chest pain, unspecified: Secondary | ICD-10-CM | POA: Insufficient documentation

## 2017-05-05 DIAGNOSIS — D649 Anemia, unspecified: Secondary | ICD-10-CM | POA: Diagnosis not present

## 2017-05-05 DIAGNOSIS — R112 Nausea with vomiting, unspecified: Secondary | ICD-10-CM | POA: Diagnosis not present

## 2017-05-05 DIAGNOSIS — I739 Peripheral vascular disease, unspecified: Secondary | ICD-10-CM

## 2017-05-05 DIAGNOSIS — D539 Nutritional anemia, unspecified: Secondary | ICD-10-CM | POA: Diagnosis not present

## 2017-05-05 DIAGNOSIS — K625 Hemorrhage of anus and rectum: Secondary | ICD-10-CM | POA: Diagnosis not present

## 2017-05-05 DIAGNOSIS — N390 Urinary tract infection, site not specified: Secondary | ICD-10-CM | POA: Insufficient documentation

## 2017-05-05 DIAGNOSIS — F0390 Unspecified dementia without behavioral disturbance: Secondary | ICD-10-CM | POA: Insufficient documentation

## 2017-05-05 DIAGNOSIS — J9811 Atelectasis: Secondary | ICD-10-CM | POA: Diagnosis not present

## 2017-05-05 HISTORY — DX: Disorder of thyroid, unspecified: E07.9

## 2017-05-05 HISTORY — DX: Gout, unspecified: M10.9

## 2017-05-05 HISTORY — DX: Essential (primary) hypertension: I10

## 2017-05-05 HISTORY — DX: Pure hypercholesterolemia, unspecified: E78.00

## 2017-05-05 LAB — I-STAT CHEM 8, ED
BUN: 25 mg/dL — AB (ref 6–20)
CALCIUM ION: 1.07 mmol/L — AB (ref 1.15–1.40)
CREATININE: 1.2 mg/dL — AB (ref 0.44–1.00)
Chloride: 99 mmol/L — ABNORMAL LOW (ref 101–111)
GLUCOSE: 119 mg/dL — AB (ref 65–99)
HCT: 34 % — ABNORMAL LOW (ref 36.0–46.0)
Hemoglobin: 11.6 g/dL — ABNORMAL LOW (ref 12.0–15.0)
Potassium: 3.8 mmol/L (ref 3.5–5.1)
Sodium: 139 mmol/L (ref 135–145)
TCO2: 30 mmol/L (ref 22–32)

## 2017-05-05 LAB — COMPREHENSIVE METABOLIC PANEL
ALBUMIN: 3.6 g/dL (ref 3.5–5.0)
ALK PHOS: 112 U/L (ref 38–126)
ALT: 31 U/L (ref 14–54)
ANION GAP: 12 (ref 5–15)
AST: 72 U/L — AB (ref 15–41)
BILIRUBIN TOTAL: 0.7 mg/dL (ref 0.3–1.2)
BUN: 20 mg/dL (ref 6–20)
CALCIUM: 8.8 mg/dL — AB (ref 8.9–10.3)
CO2: 28 mmol/L (ref 22–32)
Chloride: 98 mmol/L — ABNORMAL LOW (ref 101–111)
Creatinine, Ser: 1.2 mg/dL — ABNORMAL HIGH (ref 0.44–1.00)
GFR calc Af Amer: 44 mL/min — ABNORMAL LOW (ref >60)
GFR, EST NON AFRICAN AMERICAN: 38 mL/min — AB (ref >60)
GLUCOSE: 123 mg/dL — AB (ref 65–99)
Potassium: 3.8 mmol/L (ref 3.5–5.1)
Sodium: 138 mmol/L (ref 135–145)
TOTAL PROTEIN: 6.2 g/dL — AB (ref 6.5–8.1)

## 2017-05-05 LAB — I-STAT CG4 LACTIC ACID, ED: Lactic Acid, Venous: 1.75 mmol/L (ref 0.5–1.9)

## 2017-05-05 LAB — CBC WITH DIFFERENTIAL/PLATELET
BASOS PCT: 0 %
Basophils Absolute: 0 10*3/uL (ref 0.0–0.1)
Eosinophils Absolute: 0.1 10*3/uL (ref 0.0–0.7)
Eosinophils Relative: 1 %
HEMATOCRIT: 35.4 % — AB (ref 36.0–46.0)
HEMOGLOBIN: 11.4 g/dL — AB (ref 12.0–15.0)
LYMPHS PCT: 6 %
Lymphs Abs: 0.4 10*3/uL — ABNORMAL LOW (ref 0.7–4.0)
MCH: 30.1 pg (ref 26.0–34.0)
MCHC: 32.2 g/dL (ref 30.0–36.0)
MCV: 93.4 fL (ref 78.0–100.0)
MONOS PCT: 2 %
Monocytes Absolute: 0.1 10*3/uL (ref 0.1–1.0)
NEUTROS ABS: 6.5 10*3/uL (ref 1.7–7.7)
NEUTROS PCT: 91 %
Platelets: 198 10*3/uL (ref 150–400)
RBC: 3.79 MIL/uL — ABNORMAL LOW (ref 3.87–5.11)
RDW: 14.1 % (ref 11.5–15.5)
WBC: 7.1 10*3/uL (ref 4.0–10.5)

## 2017-05-05 LAB — I-STAT TROPONIN, ED: TROPONIN I, POC: 0.02 ng/mL (ref 0.00–0.08)

## 2017-05-05 LAB — LIPASE, BLOOD: LIPASE: 38 U/L (ref 11–51)

## 2017-05-05 MED ORDER — IOPAMIDOL (ISOVUE-370) INJECTION 76%
INTRAVENOUS | Status: AC
  Administered 2017-05-05: 100 mL
  Filled 2017-05-05: qty 100

## 2017-05-05 NOTE — ED Notes (Signed)
Delay in lab draw pt still in ct

## 2017-05-05 NOTE — ED Notes (Signed)
Delay in lab draw,  Pt not in room 

## 2017-05-05 NOTE — ED Notes (Signed)
Dr Roxanne Mins in room.

## 2017-05-05 NOTE — ED Triage Notes (Signed)
Per EMS, pt from home initially called out for n/v and complains of epigastric pain down past the naval. Daughter states that pt had bright red blood in stool. Unable to palpate pulse in left arm, moderate pulse in right arm. BP in left arm initially was 898 systolic and right arm was 421 systolic, then unable to get a BP on the left side again. 12 leads show sinus tach with RBBB. Pt being more altered upon arrival here.

## 2017-05-05 NOTE — ED Provider Notes (Signed)
[facility] EMERGENCY DEPARTMENT Provider Note   CSN: [id number] Arrival date & time: 05/05/17  2242     History   Chief Complaint Chief Complaint  Patient presents with  . Abdominal Pain  . Emesis  . Pulse pressure difference    HPI Shirley [name] is a 83 y.o. female.  The history is provided by the EMS personnel and the patient. The history is limited by the condition of the patient (Dementia).  She is reported to have complained about a sharp pain in her epigastric area which went to her back.  This was followed by some vomiting.  She states that she feels fine now.  She does remember vomiting but does not remember complaining about pain.  Her other complaint currently is that her mouth is dry.  Of note, EMS noted decreased pulses on the left and significant difference in blood pressure between right and left arm.  No past medical history on file.  There are no active problems to display for this patient.   Past Surgical History:  Procedure Laterality Date  . APPENDECTOMY      OB History    No data available       Home Medications    Prior to Admission medications   Not on File    Family History No family history on file.  Social History Social History   Tobacco Use  . Smoking status: Not on file  Substance Use Topics  . Alcohol use: Not on file  . Drug use: Not on file     Allergies   Patient has no allergy information on record.   Review of Systems Review of Systems  Unable to perform ROS: Dementia     Physical Exam Updated Vital Signs Pulse 96   Temp (!) 101 F (38.3 C) (Oral)   Resp 18   Ht 5\' 4"  (1.626 m)   Wt 68 kg (150 lb)   SpO2 96%   BMI 25.75 kg/m   Physical Exam  Nursing note and vitals reviewed.  83 year old female, resting comfortably and in no acute distress. Vital signs are significant for fever. Oxygen saturation is 96%, which is normal. Head is normocephalic and atraumatic. PERRLA, EOMI.  Oropharynx is clear.  Mucous membranes are dry. Neck is nontender and supple without adenopathy or JVD. Back is nontender and there is no CVA tenderness. Lungs are clear without rales, wheezes, or rhonchi. Chest is nontender. Heart has regular rate and rhythm with 3/6 systolic ejection murmur heard throughout the precordium but loudest at the cardiac base with some radiation to the neck. Abdomen is soft, flat, nontender without masses or hepatosplenomegaly and peristalsis is normoactive. Extremities have no cyanosis or edema, full range of motion is present.  Right radial and dorsalis pedis pulses are easily palpable, left radial pulse and left dorsalis pedis pulses are not palpable although the extremities are warm and with prompt capillary refill. Skin is warm and dry without rash. Neurologic: Awake and alert and oriented to person and place but not time, cranial nerves are intact, there are no motor or sensory deficits.  ED Treatments / Results  Labs (all labs ordered are listed, but only abnormal results are displayed) Labs Reviewed  CBC WITH DIFFERENTIAL/PLATELET - Abnormal; Notable for the following components:      Result Value   RBC 3.79 (*)    Hemoglobin 11.4 (*)    HCT 35.4 (*)    Lymphs Abs 0.4 (*)  All other components within normal limits  COMPREHENSIVE METABOLIC PANEL - Abnormal; Notable for the following components:   Chloride 98 (*)    Glucose, Bld 123 (*)    Creatinine, Ser 1.20 (*)    Calcium 8.8 (*)    Total Protein 6.2 (*)    AST 72 (*)    GFR calc non Af Amer 38 (*)    GFR calc Af Amer 44 (*)    All other components within normal limits  URINALYSIS, ROUTINE W REFLEX MICROSCOPIC - Abnormal; Notable for the following components:   APPearance HAZY (*)    Hgb urine dipstick SMALL (*)    Nitrite POSITIVE (*)    Leukocytes, UA MODERATE (*)    Bacteria, UA MANY (*)    Squamous Epithelial / LPF 0-5 (*)    All other components within normal limits  I-STAT CHEM 8,  ED - Abnormal; Notable for the following components:   Chloride 99 (*)    BUN 25 (*)    Creatinine, Ser 1.20 (*)    Glucose, Bld 119 (*)    Calcium, Ion 1.07 (*)    Hemoglobin 11.6 (*)    HCT 34.0 (*)    All other components within normal limits  CULTURE, BLOOD (ROUTINE X 2)  CULTURE, BLOOD (ROUTINE X 2)  URINE CULTURE  LIPASE, BLOOD  I-STAT TROPONIN, ED  I-STAT CG4 LACTIC ACID, ED    EKG  EKG Interpretation  Date/Time:  Friday May 05 2017 22:45:51 EST Ventricular Rate:  97 PR Interval:    QRS Duration: 146 QT Interval:  344 QTC Calculation: 437 R Axis:   41 Text Interpretation:  Sinus or ectopic atrial tachycardia Multiple ventricular premature complexes Prolonged PR interval Right bundle branch block Anteroseptal infarct, age indeterminate Lateral leads are also involved No old tracing to compare Confirmed by Delora Fuel ([id number]) on 05/05/2017 10:50:10 PM       Radiology Dg Chest 2 View  Result Date: 05/05/2017 CLINICAL DATA:  Epigastric pain. EXAM: CHEST  2 VIEW COMPARISON:  Chest CT May 05, 2017 FINDINGS: The heart, hila, mediastinum lungs, and pleura are unremarkable. Mild atelectasis in the lingula. IMPRESSION: No active cardiopulmonary disease. Electronically Signed   By: Dorise Bullion III M.D   On: 05/05/2017 23:58   Ct Angio Chest/abd/pel For Dissection W And/or Wo Contrast  Result Date: 05/06/2017 CLINICAL DATA:  83 y/o F; 83 y/o F; bright red blood per rectum. Abdominal pain. EXAM: CT ANGIOGRAPHY CHEST, ABDOMEN AND PELVIS TECHNIQUE: Multidetector CT imaging through the chest, abdomen and pelvis was performed using the standard protocol during bolus administration of intravenous contrast. Multiplanar reconstructed images and MIPs were obtained and reviewed to evaluate the vascular anatomy. CONTRAST:  184mL ISOVUE-370 IOPAMIDOL (ISOVUE-370) INJECTION 76% COMPARISON:  None. FINDINGS: CTA CHEST FINDINGS Cardiovascular: Normal heart size. No pericardial  effusion. Severe coronary, aortic valvular, and mitral annular calcification. Normal caliber thoracic aorta. Severe aortic calcific atherosclerosis. Severe stenosis of left subclavian artery origin secondary to dense calcified plaque. Mediastinum/Nodes: No enlarged mediastinal, hilar, or axillary lymph nodes. Thyroid gland, trachea, and esophagus demonstrate no significant findings. Moderate hiatal hernia. Lungs/Pleura: Smooth interlobular septal thickening. Tiny clustered nodules of the periphery of the lower lobes bilaterally. No consolidation, effusion, or pneumothorax. Musculoskeletal: No chest wall abnormality. No acute or significant osseous findings. Review of the MIP images confirms the above findings. CTA ABDOMEN AND PELVIS FINDINGS VASCULAR Aorta: Normal caliber aorta without aneurysm, dissection, vasculitis or significant stenosis. Severe calcific atherosclerosis. Celiac: Mild less  than 50% stenosis of the origin. SMA: Mild to moderate 50% stenosis of the origin. Renals: Both renal arteries are patent without evidence of aneurysm, dissection, vasculitis, fibromuscular dysplasia or significant stenosis. IMA: Patent without evidence of aneurysm, dissection, vasculitis or significant stenosis. Inflow: Left common iliac artery focal dissection/ penetrating ulcer (series 6, image 196) without significant stenosis. Otherwise unremarkable. Veins: No obvious venous abnormality within the limitations of this arterial phase study. Review of the MIP images confirms the above findings. NON-VASCULAR Hepatobiliary: No focal liver abnormality is seen. No gallstones, gallbladder wall thickening, or biliary dilatation. Pancreas: Unremarkable. No pancreatic ductal dilatation or surrounding inflammatory changes. Spleen: Normal in size without focal abnormality. Adrenals/Urinary Tract: Adrenal glands are unremarkable. Kidneys are normal, without renal calculi, focal lesion, or hydronephrosis. Bladder is unremarkable.  Stomach/Bowel: Stomach is within normal limits. Appendix appears normal. No evidence of bowel wall thickening, distention, or inflammatory changes. Lymphatic: No significant vascular findings are present. No enlarged abdominal or pelvic lymph nodes. Reproductive: Uterus and bilateral adnexa are unremarkable. Other: No abdominal wall hernia or abnormality. No abdominopelvic ascites. Musculoskeletal: Moderate multilevel degenerative changes of the spine greatest at the L1-2 and T9-10 levels. No acute fracture. Review of the MIP images confirms the above findings. IMPRESSION: 1. No aortic dissection, aneurysm, or findings of vasculitis. 2. Left common iliac artery focal dissection/penetrating ulcer without significant stenosis. 3. Severe aortic, coronary artery, aortic valvular, and mitral annular calcifications. 4. Left subclavian artery origin severe stenosis secondary to dense calcified plaque. 5. Mild-moderate 50% SMA origin stenosis. 6. Mild less than 50% celiac axis origin stenosis. 7. Moderate hiatal hernia. 8. Mild interstitial edema. 9. Minimal bronchiolitis at the lung bases. Electronically Signed   By: Kristine Garbe M.D.   On: 05/06/2017 00:20    Procedures Procedures (including critical care time) CRITICAL CARE Performed by: Delora Fuel Total critical care time: 40 minutes Critical care time was exclusive of separately billable procedures and treating other patients. Critical care was necessary to treat or prevent imminent or life-threatening deterioration. Critical care was time spent personally by me on the following activities: development of treatment plan with patient and/or surrogate as well as nursing, discussions with consultants, evaluation of patient's response to treatment, examination of patient, obtaining history from patient or surrogate, ordering and performing treatments and interventions, ordering and review of laboratory studies, ordering and review of radiographic  studies, pulse oximetry and re-evaluation of patient's condition.  Medications Ordered in ED Medications  iopamidol (ISOVUE-370) 76 % injection (100 mLs  Contrast Given 05/05/17 2328)  sodium chloride 0.9 % bolus 500 mL (0 mLs Intravenous Stopped 05/06/17 0124)  cefTRIAXone (ROCEPHIN) 1 g in dextrose 5 % 50 mL IVPB (1 g Intravenous New Bag/Given 05/06/17 0242)     Initial Impression / Assessment and Plan / ED Course  I have reviewed the triage vital signs and the nursing notes.  Pertinent labs & imaging results that were available during my care of the patient were reviewed by me and considered in my medical decision making (see chart for details).  Transient abdominal pain and vomiting with unequal pulses.  Blood pressure difference between right arm and left arm is approximately 50 mm.  This is very worrisome for aortic dissection and she will be sent for CT angiogram of chest, abdomen, pelvis looking for aortic dissection.  Fever is also present and she will need to be evaluated for occult sepsis.  Lactic acid has been ordered which would show evidence of sepsis but also evidence of  tissue ischemia from dissection.  Family has arrived and stated they were concerned because of vomiting, abdominal pain, and that when she urinated it looked like she was having some bleeding.  She urinated and had a bowel movement in the ED with no evidence of bleeding.  Lactic acid level is normal.  CT angiogram shows atherosclerotic disease and one small area of dissection in the left common iliac artery which is not felt to be clinically significant.  Family relates that she has had the blood pressure discrepancy for long time.  Laboratory workup is significant for urine with positive nitrite and many bacteria.  It is not clear if urinary tract infection contributed to her symptoms tonight, but it should be treated.  She is given a dose of ceftriaxone.  I discussed with family advisability of having a vascular  surgeon to go over her CT angiogram.  They state that she has seen Dr. Einar Gip of cardiology in the past and I have advised that that would be reasonable place to start.  She is discharged with prescriptions for ondansetron and cephalexin.  Return precautions discussed.  Final Clinical Impressions(s) / ED Diagnoses   Final diagnoses:  Abdominal pain, unspecified abdominal location  Non-intractable vomiting with nausea, unspecified vomiting type  Urinary tract infection without hematuria, site unspecified  Peripheral vascular disease (HCC)  Normochromic normocytic anemia    ED Discharge Orders        Ordered    cephALEXin (KEFLEX) 500 MG capsule  3 times daily     05/06/17 0400    ondansetron (ZOFRAN) 4 MG tablet  Every 8 hours PRN     27/51/70 0174       Delora Fuel, MD 94/49/67 [phone]

## 2017-05-06 ENCOUNTER — Encounter (HOSPITAL_COMMUNITY): Payer: Self-pay | Admitting: Emergency Medicine

## 2017-05-06 DIAGNOSIS — N390 Urinary tract infection, site not specified: Secondary | ICD-10-CM | POA: Diagnosis not present

## 2017-05-06 LAB — URINALYSIS, ROUTINE W REFLEX MICROSCOPIC
BILIRUBIN URINE: NEGATIVE
Glucose, UA: NEGATIVE mg/dL
Ketones, ur: NEGATIVE mg/dL
Nitrite: POSITIVE — AB
PH: 7 (ref 5.0–8.0)
Protein, ur: NEGATIVE mg/dL
SPECIFIC GRAVITY, URINE: 1.024 (ref 1.005–1.030)

## 2017-05-06 MED ORDER — SODIUM CHLORIDE 0.9 % IV BOLUS (SEPSIS)
500.0000 mL | Freq: Once | INTRAVENOUS | Status: AC
  Administered 2017-05-06: 500 mL via INTRAVENOUS

## 2017-05-06 MED ORDER — DEXTROSE 5 % IV SOLN
1.0000 g | Freq: Once | INTRAVENOUS | Status: AC
  Administered 2017-05-06: 1 g via INTRAVENOUS
  Filled 2017-05-06: qty 10

## 2017-05-06 MED ORDER — CEPHALEXIN 500 MG PO CAPS: 500.0000 mg | ORAL_CAPSULE | Freq: Three times a day (TID) | ORAL | 0 refills | Status: DC

## 2017-05-06 MED ORDER — ONDANSETRON HCL 4 MG PO TABS: 4.0000 mg | ORAL_TABLET | Freq: Three times a day (TID) | ORAL | 0 refills | Status: DC | PRN

## 2017-05-06 NOTE — ED Notes (Signed)
Registration at bedside.

## 2017-05-06 NOTE — ED Notes (Signed)
Pt provided with coke.  Okay'd by MD

## 2017-05-06 NOTE — ED Notes (Signed)
MD at bedside updating patient/family 

## 2017-05-06 NOTE — ED Notes (Signed)
MD aware that patient already emptied her bladder, but had stool in the same pan, so unable to collect.  Will give fluids and reassess for continence or need for catheter

## 2017-05-06 NOTE — Discharge Instructions (Signed)
Return if she is having any problems.  If the culture shows she needs to be on a different antibiotic, we will call you.

## 2017-05-08 LAB — URINE CULTURE: Culture: >=100000 — AB

## 2017-05-10 ENCOUNTER — Telehealth: Payer: Self-pay | Admitting: Emergency Medicine

## 2017-05-10 LAB — CULTURE, BLOOD (ROUTINE X 2)
CULTURE: NO GROWTH
SPECIAL REQUESTS: ADEQUATE

## 2017-05-10 NOTE — Telephone Encounter (Signed)
Post ED Visit - Positive Culture Follow-up  Culture report reviewed by antimicrobial stewardship pharmacist:  []  Elenor Quinones, Pharm.D. []  Heide Guile, Pharm.D., BCPS AQ-ID [x]  Parks Neptune, Pharm.D., BCPS []  Alycia Rossetti, Pharm.D., BCPS []  [place], Pharm.D., BCPS, AAHIVP []  Legrand Como, Pharm.D., BCPS, AAHIVP []  Salome Arnt, PharmD, BCPS []  Dimitri Ped, PharmD, BCPS []  Vincenza Hews, PharmD, BCPS  Positive urine culture Treated with cephalexin, organism sensitive to the same and no further patient follow-up is required at this time.  Hazle Nordmann 05/10/2017, 3:47 PM

## 2017-05-11 LAB — CULTURE, BLOOD (ROUTINE X 2)
Culture: NO GROWTH
SPECIAL REQUESTS: ADEQUATE

## 2017-08-23 DIAGNOSIS — D0439 Carcinoma in situ of skin of other parts of face: Secondary | ICD-10-CM | POA: Diagnosis not present

## 2017-08-23 DIAGNOSIS — L570 Actinic keratosis: Secondary | ICD-10-CM | POA: Diagnosis not present

## 2017-08-23 DIAGNOSIS — L905 Scar conditions and fibrosis of skin: Secondary | ICD-10-CM | POA: Diagnosis not present

## 2017-08-23 DIAGNOSIS — X32XXXD Exposure to sunlight, subsequent encounter: Secondary | ICD-10-CM | POA: Diagnosis not present

## 2017-09-13 ENCOUNTER — Inpatient Hospital Stay (HOSPITAL_COMMUNITY)
Admission: EM | Admit: 2017-09-13 | Discharge: 2017-09-26 | DRG: 377 | Disposition: A | Discharge status: Skilled Nursing Facility | Payer: Medicare Other | Attending: Internal Medicine | Admitting: Internal Medicine

## 2017-09-13 ENCOUNTER — Encounter (HOSPITAL_COMMUNITY): Payer: Self-pay | Admitting: *Deleted

## 2017-09-13 DIAGNOSIS — K5520 Angiodysplasia of colon without hemorrhage: Secondary | ICD-10-CM | POA: Diagnosis present

## 2017-09-13 DIAGNOSIS — K31819 Angiodysplasia of stomach and duodenum without bleeding: Secondary | ICD-10-CM | POA: Diagnosis not present

## 2017-09-13 DIAGNOSIS — Z79899 Other long term (current) drug therapy: Secondary | ICD-10-CM

## 2017-09-13 DIAGNOSIS — Y848 Other medical procedures as the cause of abnormal reaction of the patient, or of later complication, without mention of misadventure at the time of the procedure: Secondary | ICD-10-CM | POA: Diagnosis not present

## 2017-09-13 DIAGNOSIS — E569 Vitamin deficiency, unspecified: Secondary | ICD-10-CM | POA: Diagnosis not present

## 2017-09-13 DIAGNOSIS — N183 Chronic kidney disease, stage 3 (moderate): Secondary | ICD-10-CM | POA: Diagnosis present

## 2017-09-13 DIAGNOSIS — R2681 Unsteadiness on feet: Secondary | ICD-10-CM | POA: Diagnosis not present

## 2017-09-13 DIAGNOSIS — D62 Acute posthemorrhagic anemia: Secondary | ICD-10-CM | POA: Diagnosis present

## 2017-09-13 DIAGNOSIS — R451 Restlessness and agitation: Secondary | ICD-10-CM | POA: Diagnosis not present

## 2017-09-13 DIAGNOSIS — F419 Anxiety disorder, unspecified: Secondary | ICD-10-CM | POA: Diagnosis not present

## 2017-09-13 DIAGNOSIS — K5900 Constipation, unspecified: Secondary | ICD-10-CM | POA: Diagnosis not present

## 2017-09-13 DIAGNOSIS — R339 Retention of urine, unspecified: Secondary | ICD-10-CM | POA: Diagnosis not present

## 2017-09-13 DIAGNOSIS — F05 Delirium due to known physiological condition: Secondary | ICD-10-CM | POA: Diagnosis not present

## 2017-09-13 DIAGNOSIS — I480 Paroxysmal atrial fibrillation: Secondary | ICD-10-CM | POA: Diagnosis not present

## 2017-09-13 DIAGNOSIS — J441 Chronic obstructive pulmonary disease with (acute) exacerbation: Secondary | ICD-10-CM | POA: Diagnosis not present

## 2017-09-13 DIAGNOSIS — I10 Essential (primary) hypertension: Secondary | ICD-10-CM | POA: Diagnosis not present

## 2017-09-13 DIAGNOSIS — Y95 Nosocomial condition: Secondary | ICD-10-CM | POA: Diagnosis present

## 2017-09-13 DIAGNOSIS — D649 Anemia, unspecified: Secondary | ICD-10-CM | POA: Diagnosis not present

## 2017-09-13 DIAGNOSIS — D500 Iron deficiency anemia secondary to blood loss (chronic): Secondary | ICD-10-CM | POA: Diagnosis not present

## 2017-09-13 DIAGNOSIS — R52 Pain, unspecified: Secondary | ICD-10-CM | POA: Diagnosis not present

## 2017-09-13 DIAGNOSIS — R9431 Abnormal electrocardiogram [ECG] [EKG]: Secondary | ICD-10-CM | POA: Diagnosis not present

## 2017-09-13 DIAGNOSIS — R278 Other lack of coordination: Secondary | ICD-10-CM | POA: Diagnosis not present

## 2017-09-13 DIAGNOSIS — N179 Acute kidney failure, unspecified: Secondary | ICD-10-CM | POA: Diagnosis present

## 2017-09-13 DIAGNOSIS — I4891 Unspecified atrial fibrillation: Secondary | ICD-10-CM | POA: Diagnosis not present

## 2017-09-13 DIAGNOSIS — R531 Weakness: Secondary | ICD-10-CM | POA: Diagnosis not present

## 2017-09-13 DIAGNOSIS — R05 Cough: Secondary | ICD-10-CM | POA: Diagnosis not present

## 2017-09-13 DIAGNOSIS — K922 Gastrointestinal hemorrhage, unspecified: Secondary | ICD-10-CM | POA: Diagnosis not present

## 2017-09-13 DIAGNOSIS — Q2733 Arteriovenous malformation of digestive system vessel: Secondary | ICD-10-CM | POA: Diagnosis not present

## 2017-09-13 DIAGNOSIS — R404 Transient alteration of awareness: Secondary | ICD-10-CM | POA: Diagnosis not present

## 2017-09-13 DIAGNOSIS — R55 Syncope and collapse: Secondary | ICD-10-CM | POA: Diagnosis not present

## 2017-09-13 DIAGNOSIS — I5031 Acute diastolic (congestive) heart failure: Secondary | ICD-10-CM | POA: Diagnosis present

## 2017-09-13 DIAGNOSIS — Z111 Encounter for screening for respiratory tuberculosis: Secondary | ICD-10-CM | POA: Diagnosis not present

## 2017-09-13 DIAGNOSIS — I350 Nonrheumatic aortic (valve) stenosis: Secondary | ICD-10-CM

## 2017-09-13 DIAGNOSIS — R0602 Shortness of breath: Secondary | ICD-10-CM | POA: Diagnosis not present

## 2017-09-13 DIAGNOSIS — M1000 Idiopathic gout, unspecified site: Secondary | ICD-10-CM | POA: Diagnosis not present

## 2017-09-13 DIAGNOSIS — K2971 Gastritis, unspecified, with bleeding: Secondary | ICD-10-CM | POA: Diagnosis not present

## 2017-09-13 DIAGNOSIS — M109 Gout, unspecified: Secondary | ICD-10-CM | POA: Diagnosis present

## 2017-09-13 DIAGNOSIS — K2970 Gastritis, unspecified, without bleeding: Secondary | ICD-10-CM | POA: Diagnosis not present

## 2017-09-13 DIAGNOSIS — E861 Hypovolemia: Secondary | ICD-10-CM | POA: Diagnosis present

## 2017-09-13 DIAGNOSIS — E7800 Pure hypercholesterolemia, unspecified: Secondary | ICD-10-CM | POA: Diagnosis present

## 2017-09-13 DIAGNOSIS — J440 Chronic obstructive pulmonary disease with acute lower respiratory infection: Secondary | ICD-10-CM | POA: Diagnosis not present

## 2017-09-13 DIAGNOSIS — Z515 Encounter for palliative care: Secondary | ICD-10-CM | POA: Diagnosis not present

## 2017-09-13 DIAGNOSIS — K219 Gastro-esophageal reflux disease without esophagitis: Secondary | ICD-10-CM | POA: Diagnosis not present

## 2017-09-13 DIAGNOSIS — D638 Anemia in other chronic diseases classified elsewhere: Secondary | ICD-10-CM | POA: Diagnosis present

## 2017-09-13 DIAGNOSIS — I5033 Acute on chronic diastolic (congestive) heart failure: Secondary | ICD-10-CM | POA: Diagnosis not present

## 2017-09-13 DIAGNOSIS — K921 Melena: Secondary | ICD-10-CM | POA: Diagnosis not present

## 2017-09-13 DIAGNOSIS — K2901 Acute gastritis with bleeding: Principal | ICD-10-CM | POA: Diagnosis present

## 2017-09-13 DIAGNOSIS — K449 Diaphragmatic hernia without obstruction or gangrene: Secondary | ICD-10-CM | POA: Diagnosis not present

## 2017-09-13 DIAGNOSIS — J189 Pneumonia, unspecified organism: Secondary | ICD-10-CM | POA: Diagnosis not present

## 2017-09-13 DIAGNOSIS — D509 Iron deficiency anemia, unspecified: Secondary | ICD-10-CM | POA: Diagnosis not present

## 2017-09-13 DIAGNOSIS — I959 Hypotension, unspecified: Secondary | ICD-10-CM | POA: Diagnosis present

## 2017-09-13 DIAGNOSIS — I5030 Unspecified diastolic (congestive) heart failure: Secondary | ICD-10-CM | POA: Diagnosis not present

## 2017-09-13 DIAGNOSIS — I080 Rheumatic disorders of both mitral and aortic valves: Secondary | ICD-10-CM | POA: Diagnosis present

## 2017-09-13 DIAGNOSIS — I509 Heart failure, unspecified: Secondary | ICD-10-CM | POA: Diagnosis not present

## 2017-09-13 DIAGNOSIS — E8771 Transfusion associated circulatory overload: Secondary | ICD-10-CM | POA: Diagnosis not present

## 2017-09-13 DIAGNOSIS — K8020 Calculus of gallbladder without cholecystitis without obstruction: Secondary | ICD-10-CM | POA: Diagnosis not present

## 2017-09-13 DIAGNOSIS — F0391 Unspecified dementia with behavioral disturbance: Secondary | ICD-10-CM | POA: Diagnosis not present

## 2017-09-13 DIAGNOSIS — E039 Hypothyroidism, unspecified: Secondary | ICD-10-CM | POA: Diagnosis not present

## 2017-09-13 DIAGNOSIS — Z66 Do not resuscitate: Secondary | ICD-10-CM | POA: Diagnosis present

## 2017-09-13 DIAGNOSIS — I4510 Unspecified right bundle-branch block: Secondary | ICD-10-CM | POA: Diagnosis present

## 2017-09-13 DIAGNOSIS — R42 Dizziness and giddiness: Secondary | ICD-10-CM | POA: Diagnosis not present

## 2017-09-13 DIAGNOSIS — K2900 Acute gastritis without bleeding: Secondary | ICD-10-CM | POA: Diagnosis not present

## 2017-09-13 DIAGNOSIS — M6281 Muscle weakness (generalized): Secondary | ICD-10-CM | POA: Diagnosis not present

## 2017-09-13 DIAGNOSIS — Z09 Encounter for follow-up examination after completed treatment for conditions other than malignant neoplasm: Secondary | ICD-10-CM

## 2017-09-13 DIAGNOSIS — I130 Hypertensive heart and chronic kidney disease with heart failure and stage 1 through stage 4 chronic kidney disease, or unspecified chronic kidney disease: Secondary | ICD-10-CM | POA: Diagnosis present

## 2017-09-13 DIAGNOSIS — E876 Hypokalemia: Secondary | ICD-10-CM | POA: Diagnosis present

## 2017-09-13 DIAGNOSIS — N139 Obstructive and reflux uropathy, unspecified: Secondary | ICD-10-CM | POA: Diagnosis not present

## 2017-09-13 DIAGNOSIS — Z87891 Personal history of nicotine dependence: Secondary | ICD-10-CM | POA: Diagnosis not present

## 2017-09-13 DIAGNOSIS — R0600 Dyspnea, unspecified: Secondary | ICD-10-CM

## 2017-09-13 DIAGNOSIS — J9601 Acute respiratory failure with hypoxia: Secondary | ICD-10-CM | POA: Diagnosis not present

## 2017-09-13 DIAGNOSIS — Z7189 Other specified counseling: Secondary | ICD-10-CM | POA: Diagnosis not present

## 2017-09-13 DIAGNOSIS — R5383 Other fatigue: Secondary | ICD-10-CM | POA: Diagnosis not present

## 2017-09-13 DIAGNOSIS — Z9221 Personal history of antineoplastic chemotherapy: Secondary | ICD-10-CM | POA: Diagnosis not present

## 2017-09-13 HISTORY — DX: Unspecified atrial fibrillation: I48.91

## 2017-09-13 HISTORY — DX: Cardiac murmur, unspecified: R01.1

## 2017-09-13 LAB — BASIC METABOLIC PANEL
Anion gap: 11 (ref 5–15)
BUN: 61 mg/dL — ABNORMAL HIGH (ref 6–20)
CO2: 24 mmol/L (ref 22–32)
Calcium: 8.4 mg/dL — ABNORMAL LOW (ref 8.9–10.3)
Chloride: 105 mmol/L (ref 101–111)
Creatinine, Ser: 1.37 mg/dL — ABNORMAL HIGH (ref 0.44–1.00)
GFR calc Af Amer: 37 mL/min — ABNORMAL LOW (ref >60)
GFR calc non Af Amer: 32 mL/min — ABNORMAL LOW (ref >60)
Glucose, Bld: 146 mg/dL — ABNORMAL HIGH (ref 65–99)
Potassium: 3.5 mmol/L (ref 3.5–5.1)
Sodium: 140 mmol/L (ref 135–145)

## 2017-09-13 LAB — CBC
HCT: 17.3 % — ABNORMAL LOW (ref 36.0–46.0)
Hemoglobin: 5.5 g/dL — CL (ref 12.0–15.0)
MCH: 32.4 pg (ref 26.0–34.0)
MCHC: 31.8 g/dL (ref 30.0–36.0)
MCV: 101.8 fL — ABNORMAL HIGH (ref 78.0–100.0)
Platelets: 247 10*3/uL (ref 150–400)
RBC: 1.7 MIL/uL — ABNORMAL LOW (ref 3.87–5.11)
RDW: 20.9 % — ABNORMAL HIGH (ref 11.5–15.5)
WBC: 11.7 10*3/uL — ABNORMAL HIGH (ref 4.0–10.5)

## 2017-09-13 LAB — ABO/RH: ABO/RH(D): A POS

## 2017-09-13 LAB — PREPARE RBC (CROSSMATCH)

## 2017-09-13 MED ORDER — FERROUS SULFATE 325 (65 FE) MG PO TABS
325.0000 mg | ORAL_TABLET | Freq: Every evening | ORAL | Status: DC
  Administered 2017-09-13 – 2017-09-25 (×13): 325 mg via ORAL
  Filled 2017-09-13 (×13): qty 1

## 2017-09-13 MED ORDER — LEVOTHYROXINE SODIUM 50 MCG PO TABS
50.0000 ug | ORAL_TABLET | Freq: Every day | ORAL | Status: DC
  Administered 2017-09-15 – 2017-09-26 (×12): 50 ug via ORAL
  Filled 2017-09-13 (×12): qty 1

## 2017-09-13 MED ORDER — SODIUM CHLORIDE 0.9 % IV SOLN
INTRAVENOUS | Status: DC
  Administered 2017-09-13 – 2017-09-15 (×4): via INTRAVENOUS

## 2017-09-13 MED ORDER — SODIUM CHLORIDE 0.9 % IV SOLN
8.0000 mg/h | INTRAVENOUS | Status: DC
  Administered 2017-09-13 – 2017-09-14 (×4): 8 mg/h via INTRAVENOUS
  Filled 2017-09-13 (×6): qty 80

## 2017-09-13 MED ORDER — PANTOPRAZOLE SODIUM 40 MG IV SOLR
80.0000 mg | Freq: Once | INTRAVENOUS | Status: AC
  Administered 2017-09-13: 80 mg via INTRAVENOUS
  Filled 2017-09-13: qty 80

## 2017-09-13 MED ORDER — MECLIZINE HCL 25 MG PO TABS
25.0000 mg | ORAL_TABLET | Freq: Three times a day (TID) | ORAL | Status: DC | PRN
Start: 2017-09-13 — End: 2017-09-26
  Administered 2017-09-22: 25 mg via ORAL
  Filled 2017-09-13: qty 1

## 2017-09-13 MED ORDER — BOOST / RESOURCE BREEZE PO LIQD CUSTOM
1.0000 | Freq: Three times a day (TID) | ORAL | Status: DC
  Administered 2017-09-14 – 2017-09-22 (×9): 1 via ORAL

## 2017-09-13 MED ORDER — SODIUM CHLORIDE 0.9 % IV SOLN
10.0000 mL/h | Freq: Once | INTRAVENOUS | Status: AC
  Administered 2017-09-15: 10 mL/h via INTRAVENOUS

## 2017-09-13 MED ORDER — SIMVASTATIN 40 MG PO TABS
40.0000 mg | ORAL_TABLET | Freq: Every day | ORAL | Status: DC
  Administered 2017-09-13 – 2017-09-26 (×14): 40 mg via ORAL
  Filled 2017-09-13 (×14): qty 1

## 2017-09-13 NOTE — ED Triage Notes (Signed)
Per EMS, pt from home complains of black tarry stool and weakness for the past 4 days. Pt was hypotensive w/ EMS, BP 60/30.

## 2017-09-13 NOTE — ED Notes (Signed)
ED TO INPATIENT HANDOFF REPORT  Name/Age/Gender Shirley [name] 84 y.o. female  Code Status    Code Status Orders  (From admission, onward)        Start     Ordered   09/13/17 1732  Do not attempt resuscitation (DNR)  Continuous    Question Answer Comment  In the event of cardiac or respiratory ARREST Do not call a "code blue"   In the event of cardiac or respiratory ARREST Do not perform Intubation, CPR, defibrillation or ACLS   In the event of cardiac or respiratory ARREST Use medication by any route, position, wound care, and other measures to relive pain and suffering. May use oxygen, suction and manual treatment of airway obstruction as needed for comfort.      09/13/17 1733    Code Status History    This patient has a current code status but no historical code status.      Home/SNF/Other Home  Chief Complaint hypotension   Level of Care/Admitting Diagnosis ED Disposition    ED Disposition Condition Comment   Admit  Hospital Area: [facility] [[id number]]  Level of Care: Telemetry [5]  Admit to tele based on following criteria: Monitor for Ischemic changes  Diagnosis: GI bleed [177939]  Admitting Physician: Kayleen Memos [[id number]]  Attending Physician: Kayleen Memos [[id number]]  Estimated length of stay: past midnight tomorrow  Certification:: I certify this patient will need inpatient services for at least 2 midnights  PT Class (Do Not Modify): Inpatient [101]  PT Acc Code (Do Not Modify): Private [1]       Medical History Past Medical History:  Diagnosis Date  . Gout   . Hypercholesteremia   . Hypertension   . Thyroid disease    hypothyroid    Allergies No Known Allergies  IV Location/Drains/Wounds Patient Lines/Drains/Airways Status   Active Line/Drains/Airways    Name:   Placement date:   Placement time:   Site:   Days:   Peripheral IV 05/05/17 Left Antecubital   05/05/17    -    Antecubital   131   Peripheral IV  05/05/17 Right Antecubital   05/05/17    2253    Antecubital   131          Labs/Imaging Results for orders placed or performed during the hospital encounter of 09/13/17 (from the past 48 hour(s))  Basic metabolic panel     Status: Abnormal   Collection Time: 09/13/17  4:09 PM  Result Value Ref Range   Sodium 140 135 - 145 mmol/L   Potassium 3.5 3.5 - 5.1 mmol/L   Chloride 105 101 - 111 mmol/L   CO2 24 22 - 32 mmol/L   Glucose, Bld 146 (H) 65 - 99 mg/dL   BUN 61 (H) 6 - 20 mg/dL   Creatinine, Ser 1.37 (H) 0.44 - 1.00 mg/dL   Calcium 8.4 (L) 8.9 - 10.3 mg/dL   GFR calc non Af Amer 32 (L) >60 mL/min   GFR calc Af Amer 37 (L) >60 mL/min    Comment: (NOTE) The eGFR has been calculated using the CKD EPI equation. This calculation has not been validated in all clinical situations. eGFR's persistently <60 mL/min signify possible Chronic Kidney Disease.    Anion gap 11 5 - 15    Comment: Performed at [facility], [place] [address], [place], [place] [address]  CBC     Status: Abnormal   Collection Time: 09/13/17  4:09  PM  Result Value Ref Range   WBC 11.7 (H) 4.0 - 10.5 K/uL   RBC 1.70 (L) 3.87 - 5.11 MIL/uL   Hemoglobin 5.5 (LL) 12.0 - 15.0 g/dL    Comment: REPEATED TO VERIFY CRITICAL RESULT CALLED TO, READ BACK BY AND VERIFIED WITH: Dajahnae Vondra,M. RN _0  ON 04.17.19 BY COHEN,K    HCT 17.3 (L) 36.0 - 46.0 %   MCV 101.8 (H) 78.0 - 100.0 fL   MCH 32.4 26.0 - 34.0 pg   MCHC 31.8 30.0 - 36.0 g/dL   RDW 20.9 (H) 11.5 - 15.5 %   Platelets 247 150 - 400 K/uL    Comment: Performed at [facility], [place] [address], [place], [place] [address]  Type and screen [place]     Status: None   Collection Time: 09/13/17  5:03 PM  Result Value Ref Range   ABO/RH(D) A POS    Antibody Screen NEG    Sample Expiration      09/16/2017 Performed at [facility], [place] [address] [place], [place] [address]   ABO/Rh      Status: None (Preliminary result)   Collection Time: 09/13/17  5:03 PM  Result Value Ref Range   ABO/RH(D)      A POS Performed at [facility], [place] [address], [place], [place] [address]   Prepare RBC     Status: None   Collection Time: 09/13/17  5:04 PM  Result Value Ref Range   Order Confirmation      ORDER PROCESSED BY BLOOD BANK Performed at [facility] [address], [place], [place] [address]    No results found.  Pending Labs Unresulted Labs (From admission, onward)   Start     Ordered   09/13/17 1622  Occult blood card to lab, stool  Once,   STAT     09/13/17 1621      Vitals/Pain Today's Vitals   09/13/17 1532 09/13/17 1535 09/13/17 1613 09/13/17 1736  BP:   (!) 123/35 (!) 110/50  Pulse: 82  86 94  Resp:   19 19  Temp:      TempSrc:      SpO2:  93% 99% 96%  PainSc:        Isolation Precautions No active isolations  Medications Medications  0.9 %  sodium chloride infusion (has no administration in time range)  0.9 %  sodium chloride infusion ( Intravenous New Bag/Given 09/13/17 1716)  pantoprazole (PROTONIX) 80 mg in sodium chloride 0.9 % 100 mL IVPB (80 mg Intravenous New Bag/Given 09/13/17 1715)    Mobility walks

## 2017-09-13 NOTE — H&P (Addendum)
History and Physical  Shirley [name] ZDG:[id number] DOB: 1922/03/31 DOA: 09/13/2017  Referring physician: Dr Tyrone Nine PCP: Jani Gravel, MD  Outpatient Specialists:  Patient coming from: Home Chief Complaint: Generalized weakness.  Sent from PCP with hemoglobin of 5.5  HPI: Shirley [name] is a 84 y.o. female with medical history significant for hypertension, heart murmur, hypothyroidism who presented to ED [facility] with complaints of generalized weakness and symptomatic anemia.  Patient went to her PCP today to be evaluated for generalized weakness and poor oral intake of 3-4 days duration.  Patient is in the room accompanied by her son and daughter-in-law who report recent black tarry stools.  The patient denies abdominal pain or chronic use of NSAIDs.  She has never had an endoscopy or colonoscopy.  She denies chest pain, palpitations, or dyspnea.  ED Course: Hemoglobin of 5.5 on presentation to the ED with MCV of 101.8.  Baseline hemoglobin 11.  2 units of PRBCs ordered to be transfused.  GI contacted by ED physician.  Hypotensive and tachycardic.  IV fluids and Protonix drip initiated.  Review of Systems: Review of systems as stated in HPI.  All other systems reviewed and are negative.   Past Medical History:  Diagnosis Date  . Gout   . Hypercholesteremia   . Hypertension   . Thyroid disease    hypothyroid   Past Surgical History:  Procedure Laterality Date  . APPENDECTOMY      Social History:  reports that she has quit smoking. She has never used smokeless tobacco. She reports that she does not drink alcohol or use drugs.   No Known Allergies  No family history on file.  Patient denies family history of colon cancer or GI disease.  Prior to Admission medications   Medication Sig Start Date End Date Taking? Authorizing Provider  acetaminophen (TYLENOL) 325 MG tablet Take 650 mg by mouth every 6 (six) hours as needed for moderate pain.   Yes [provider]    allopurinol (ZYLOPRIM) 300 MG tablet Take 300 mg by mouth daily.   Yes [provider]  carvedilol (COREG) 12.5 MG tablet Take 12.5 mg by mouth 2 (two) times daily with a meal.   Yes [provider]  cholecalciferol (VITAMIN D) 1000 units tablet Take 1,000 Units by mouth daily.   Yes [provider]  docusate sodium (COLACE) 250 MG capsule Take 250 mg by mouth 2 (two) times daily.   Yes [provider]  ferrous sulfate 325 (65 FE) MG EC tablet Take 325 mg by mouth every evening.   Yes [provider]  furosemide (LASIX) 40 MG tablet Take 60 mg by mouth daily.   Yes [provider]  levothyroxine (SYNTHROID, LEVOTHROID) 50 MCG tablet Take 50 mcg by mouth daily.   Yes [provider]  lisinopril (PRINIVIL,ZESTRIL) 20 MG tablet Take 20 mg by mouth 2 (two) times daily.   Yes [provider]  meclizine (ANTIVERT) 25 MG tablet Take 25 mg by mouth 3 (three) times daily as needed for dizziness.   Yes [provider]  mupirocin ointment (BACTROBAN) 2 % Apply 1 application topically 3 (three) times daily.  08/25/17  Yes [provider]  omeprazole (PRILOSEC) 20 MG capsule Take 20 mg by mouth daily.   Yes [provider]  polyethylene glycol (MIRALAX / GLYCOLAX) packet Take 17 g by mouth daily as needed for moderate constipation.   Yes [provider]  simvastatin (ZOCOR) 40 MG tablet Take 40  mg by mouth daily.   Yes [provider]  traMADol (ULTRAM) 50 MG tablet Take 50 mg by mouth 2 (two) times daily.   Yes [provider]  cephALEXin (KEFLEX) 500 MG capsule Take 1 capsule (500 mg total) by mouth 3 (three) times daily. Patient not taking: Reported on 1/61/0960 45/4/09   Delora Fuel, MD  ondansetron (ZOFRAN) 4 MG tablet Take 1 tablet (4 mg total) by mouth every 8 (eight) hours as needed for nausea or vomiting. Patient not taking: Reported on 01/07/9146 82/9/56   Delora Fuel, MD     Physical Exam: BP (!) 114/51 (BP Location: Right Arm)   Pulse (!) 108   Temp 99.3 F (37.4 C) (Oral)   Resp 18   Ht 5\' 4"  (1.626 m)   Wt 31.1 kg (68 lb 9.6 oz)   SpO2 96%   BMI 11.78 kg/m   General: 84 year old Caucasian female well-developed well-nourished nourished in no acute distress.  Alert and oriented x2.  Hard of hearing.  Skin appears pale. Eyes: Sclera anicteric ENT: Post membranous dry with no erythema or exudates Neck: No JVD or thyromegaly Cardiovascular: Regular rate and rhythm no rubs or gallops.  Grade 3 out of 6 systolic murmur. Respiratory: Auscultation with no wheezes or rales.  Good inspiratory effort Abdomen: Soft nontender nondistended with normal bowel sounds x4 Skin: Pale. Trace edema in lower extremities bilaterally Musculoskeletal: Trace edema lower extremities bilaterally.  Moves all 4 extremities. Psychiatric: Mood appropriate for condition and setting Neurologic: No focal motor deficits.  Alert and oriented x2          Labs on Admission:  Basic Metabolic Panel: Recent Labs  Lab 09/13/17 1609  NA 140  K 3.5  CL 105  CO2 24  GLUCOSE 146*  BUN 61*  CREATININE 1.37*  CALCIUM 8.4*   Liver Function Tests: No results for input(s): AST, ALT, ALKPHOS, BILITOT, PROT, ALBUMIN in the last 168 hours. No results for input(s): LIPASE, AMYLASE in the last 168 hours. No results for input(s): AMMONIA in the last 168 hours. CBC: Recent Labs  Lab 09/13/17 1609  WBC 11.7*  HGB 5.5*  HCT 17.3*  MCV 101.8*  PLT 247   Cardiac Enzymes: No results for input(s): CKTOTAL, CKMB, CKMBINDEX, TROPONINI in the last 168 hours.  BNP (last 3 results) No results for input(s): BNP in the last 8760 hours.  ProBNP (last 3 results) No results for input(s): PROBNP in the last 8760 hours.  CBG: No results for input(s): GLUCAP in the last 168 hours.  Radiological Exams on Admission: No results found.  EKG: Independently reviewed.  Personally reviewed ST  depression V5 V6 lead I and lead II.  Assessment/Plan Present on Admission: . GI bleed  Active Problems:   GI bleed  Suspected upper GI bleed Reported black tarry stools No FOBT done in the ED Order FOBT GI consulted per ED physician Dr. Wilson Singer. Received 80 milligram of IV Protonix in the ED N.p.o. after midnight Transfuse with hemoglobin less than 7.0  Acute blood loss anemia secondary to suspected upper GI bleed Hemoglobin on presentation 5.5 with baseline hemoglobin of 11 2 units PRBC ordered to be transfused Repeat CBC in the morning  Abnormal EKG Denies chest pain ST depressions in lead I and II V5 V6 Suspect secondary to severe anemia Continue telemetry monitoring Repeat 12 leads EKG in the am  QTc prolongation QTC 549 on admission EKG, personally reviewed. Avoid agents that would prolong QTC Repeat EKG in the  morning  Grade 3 out of 6 systolic murmur Patient's son reports she has had it for over 30 years No previous echocardiogram Will obtain 2D echo  Hypotension Suspect secondary to GI bleed Continue IV fluids normal saline at 75 cc/h Continue close monitoring of vital signs  CKD 3 Baseline creatinine 1.2 Creatinine 1.37 today Continue gentle IV hydration Monitor urine output Avoid nephrotoxic agents/hypotension/dehydration Repeat chemistry panel in the morning  Hypothyroidism Continue levothyroxine   DVT prophylaxis: SCDs  Code Status: DNR  Family Communication: Son and daughter-in-law at bedside  Disposition Plan: Admit to telemetry  Consults called: GI consulted per ED physician  Admission status: Inpatient    Kayleen Memos MD [facility] Pager [phone]  If 7PM-7AM, please contact night-coverage [email or web address] Password [facility]  09/13/2017, 7:39 PM

## 2017-09-13 NOTE — Progress Notes (Signed)
Patient arrived to unit. Pt is a&ox4. HOH, legally blind. Soft call button ordered.Family is at bedside.

## 2017-09-13 NOTE — ED Notes (Signed)
2 units of blood ready per blood bank, told Matt,RN

## 2017-09-14 ENCOUNTER — Inpatient Hospital Stay (HOSPITAL_COMMUNITY): Payer: Medicare Other

## 2017-09-14 ENCOUNTER — Other Ambulatory Visit: Payer: Self-pay

## 2017-09-14 ENCOUNTER — Inpatient Hospital Stay (HOSPITAL_COMMUNITY): Payer: Medicare Other | Admitting: Anesthesiology

## 2017-09-14 ENCOUNTER — Encounter (HOSPITAL_COMMUNITY)
Admission: EM | Disposition: A | Discharge status: Skilled Nursing Facility | Payer: Self-pay | Source: Home / Self Care | Attending: Internal Medicine

## 2017-09-14 ENCOUNTER — Encounter (HOSPITAL_COMMUNITY): Payer: Self-pay

## 2017-09-14 DIAGNOSIS — K2971 Gastritis, unspecified, with bleeding: Secondary | ICD-10-CM

## 2017-09-14 DIAGNOSIS — D500 Iron deficiency anemia secondary to blood loss (chronic): Secondary | ICD-10-CM

## 2017-09-14 DIAGNOSIS — I350 Nonrheumatic aortic (valve) stenosis: Secondary | ICD-10-CM

## 2017-09-14 DIAGNOSIS — I10 Essential (primary) hypertension: Secondary | ICD-10-CM

## 2017-09-14 DIAGNOSIS — R9431 Abnormal electrocardiogram [ECG] [EKG]: Secondary | ICD-10-CM

## 2017-09-14 HISTORY — PX: ESOPHAGOGASTRODUODENOSCOPY (EGD) WITH PROPOFOL: SHX5813

## 2017-09-14 LAB — CBC
HCT: 23.8 % — ABNORMAL LOW (ref 36.0–46.0)
HCT: 25.1 % — ABNORMAL LOW (ref 36.0–46.0)
HEMOGLOBIN: 8.1 g/dL — AB (ref 12.0–15.0)
Hemoglobin: 7.9 g/dL — ABNORMAL LOW (ref 12.0–15.0)
MCH: 30.3 pg (ref 26.0–34.0)
MCH: 29.7 pg (ref 26.0–34.0)
MCHC: 33.2 g/dL (ref 30.0–36.0)
MCHC: 32.3 g/dL (ref 30.0–36.0)
MCV: 91.2 fL (ref 78.0–100.0)
MCV: 91.9 fL (ref 78.0–100.0)
PLATELETS: 198 10*3/uL (ref 150–400)
Platelets: 219 10*3/uL (ref 150–400)
RBC: 2.61 MIL/uL — ABNORMAL LOW (ref 3.87–5.11)
RBC: 2.73 MIL/uL — ABNORMAL LOW (ref 3.87–5.11)
RDW: 20 % — AB (ref 11.5–15.5)
RDW: 21.1 % — ABNORMAL HIGH (ref 11.5–15.5)
WBC: 9.8 10*3/uL (ref 4.0–10.5)
WBC: 10.1 10*3/uL (ref 4.0–10.5)

## 2017-09-14 LAB — COMPREHENSIVE METABOLIC PANEL
ALT: 10 U/L — ABNORMAL LOW (ref 14–54)
AST: 15 U/L (ref 15–41)
Albumin: 2.4 g/dL — ABNORMAL LOW (ref 3.5–5.0)
Alkaline Phosphatase: 47 U/L (ref 38–126)
Anion gap: 9 (ref 5–15)
BUN: 59 mg/dL — ABNORMAL HIGH (ref 6–20)
CHLORIDE: 108 mmol/L (ref 101–111)
CO2: 25 mmol/L (ref 22–32)
Calcium: 8.2 mg/dL — ABNORMAL LOW (ref 8.9–10.3)
Creatinine, Ser: 1.29 mg/dL — ABNORMAL HIGH (ref 0.44–1.00)
GFR calc Af Amer: 40 mL/min — ABNORMAL LOW (ref >60)
GFR, EST NON AFRICAN AMERICAN: 34 mL/min — AB (ref >60)
Glucose, Bld: 110 mg/dL — ABNORMAL HIGH (ref 65–99)
POTASSIUM: 3 mmol/L — AB (ref 3.5–5.1)
SODIUM: 142 mmol/L (ref 135–145)
Total Bilirubin: 0.6 mg/dL (ref 0.3–1.2)
Total Protein: 4.4 g/dL — ABNORMAL LOW (ref 6.5–8.1)

## 2017-09-14 LAB — BASIC METABOLIC PANEL
ANION GAP: 10 (ref 5–15)
BUN: 51 mg/dL — ABNORMAL HIGH (ref 6–20)
CALCIUM: 8.2 mg/dL — AB (ref 8.9–10.3)
CO2: 22 mmol/L (ref 22–32)
Chloride: 109 mmol/L (ref 101–111)
Creatinine, Ser: 1.3 mg/dL — ABNORMAL HIGH (ref 0.44–1.00)
GFR, EST AFRICAN AMERICAN: 39 mL/min — AB (ref >60)
GFR, EST NON AFRICAN AMERICAN: 34 mL/min — AB (ref >60)
GLUCOSE: 151 mg/dL — AB (ref 65–99)
Potassium: 3.3 mmol/L — ABNORMAL LOW (ref 3.5–5.1)
Sodium: 141 mmol/L (ref 135–145)

## 2017-09-14 LAB — ECHOCARDIOGRAM COMPLETE
HEIGHTINCHES: 64 in
WEIGHTICAEL: 2464 [oz_av]

## 2017-09-14 SURGERY — ESOPHAGOGASTRODUODENOSCOPY (EGD) WITH PROPOFOL
Anesthesia: Monitor Anesthesia Care

## 2017-09-14 MED ORDER — PROPOFOL 10 MG/ML IV BOLUS
INTRAVENOUS | Status: AC
  Filled 2017-09-14: qty 40

## 2017-09-14 MED ORDER — PROPOFOL 10 MG/ML IV BOLUS
INTRAVENOUS | Status: DC | PRN
  Administered 2017-09-14: 20 mg via INTRAVENOUS
  Administered 2017-09-14: 10 mg via INTRAVENOUS

## 2017-09-14 MED ORDER — POTASSIUM CHLORIDE 10 MEQ/100ML IV SOLN
10.0000 meq | INTRAVENOUS | Status: AC
  Administered 2017-09-14 (×3): 10 meq via INTRAVENOUS
  Filled 2017-09-14 (×3): qty 100

## 2017-09-14 MED ORDER — LIDOCAINE 2% (20 MG/ML) 5 ML SYRINGE
INTRAMUSCULAR | Status: DC | PRN
  Administered 2017-09-14: 80 mg via INTRAVENOUS

## 2017-09-14 MED ORDER — PROPOFOL 500 MG/50ML IV EMUL
INTRAVENOUS | Status: DC | PRN
  Administered 2017-09-14: 100 ug/kg/min via INTRAVENOUS

## 2017-09-14 SURGICAL SUPPLY — 15 items

## 2017-09-14 NOTE — Brief Op Note (Addendum)
Two tiny nonbleeding duodenal AVMs fulgurated with argon plasma coagulation (APC). Clear bile noted in stomach and examined duodenum. See endopro note for details. Doubt GI bleed due to visualized AVMs. Hold off on attempted prep for colonoscopy or capsule endoscopy unless continues to have visible bleeding or persisting anemia. Continue Protonix drip today and then can change to PO PPI if stable tomorrow. May need abd/pelvis CT as next step if continues to show signs of GI bleeding. Clear liquid diet ok. Supportive care. Dr. Watt Climes will f/u tomorrow.

## 2017-09-14 NOTE — Anesthesia Procedure Notes (Signed)
Procedure Name: MAC Performed by: Dione Booze, CRNA Pre-anesthesia Checklist: Patient identified, Suction available, Emergency Drugs available and Patient being monitored Patient Re-evaluated:Patient Re-evaluated prior to induction Oxygen Delivery Method: Nasal cannula Placement Confirmation: positive ETCO2

## 2017-09-14 NOTE — Op Note (Addendum)
[facility] Patient Name: Shirley [name] Procedure Date: 09/14/2017 MRN: [id number] Attending MD: Lear Ng , MD Date of Birth: 1921/06/01 CSN: [id number] Age: 84 Admit Type: Inpatient Procedure:                Upper GI endoscopy Indications:              Suspected upper gastrointestinal bleeding, Acute                            post hemorrhagic anemia, Melena Providers:                Lear Ng, MD, Laurell Roof, Technician Referring MD:             hospital team Medicines:                Propofol per Anesthesia, Monitored Anesthesia Care Complications:            Focal superficial linear bleeding noted due to                            scope trauma that spontaneously stopped. Estimated Blood Loss:     Estimated blood loss was minimal. Procedure:                Pre-Anesthesia Assessment:                           - Prior to the procedure, a History and Physical                            was performed, and patient medications and                            allergies were reviewed. The patient's tolerance of                            previous anesthesia was also reviewed. The risks                            and benefits of the procedure and the sedation                            options and risks were discussed with the patient.                            All questions were answered, and informed consent                            was obtained. Prior Anticoagulants: The patient has                            taken no previous anticoagulant or antiplatelet  agents. ASA Grade Assessment: IV - A patient with                            severe systemic disease that is a constant threat                            to life. After reviewing the risks and benefits,                            the patient was deemed in satisfactory condition to                            undergo the  procedure.                           After obtaining informed consent, the endoscope was                            passed under direct vision. Throughout the                            procedure, the patient's blood pressure, pulse, and                            oxygen saturations were monitored continuously. The                            [id number]-[id number] ([id number]) scope was introduced through the                            mouth, and advanced to the second part of duodenum.                            The upper GI endoscopy was accomplished without                            difficulty. The patient tolerated the procedure                            fairly well. Scope In: Scope Out: Findings:      The examined esophagus was normal.      The Z-line was regular and was found 35 cm from the incisors.      A small hiatal hernia was present.      Segmental minimal inflammation characterized by congestion (edema) and       erythema was found in the gastric antrum.      Two diminutive angiodysplastic lesions without bleeding were found in       the duodenal bulb. Fulguration to ablate the lesion by argon plasma was       successful. Estimated blood loss: none.      The exam of the duodenum was otherwise normal. Impression:               - Normal esophagus.                           -  Z-line regular, 35 cm from the incisors.                           - Small hiatal hernia.                           - Acute gastritis.                           - Two non-bleeding angiodysplastic lesions in the                            duodenum. Treated with argon plasma coagulation                            (APC).                           - No specimens collected. Moderate Sedation:      N/A- Per Anesthesia Care Recommendation:           - Clear liquid diet.                           - Observe patient's clinical course. Procedure Code(s):        --- Professional ---                           [id number],  Esophagogastroduodenoscopy, flexible,                            transoral; with ablation of tumor(s), polyp(s), or                            other lesion(s) (includes pre- and post-dilation                            and guide wire passage, when performed) Diagnosis Code(s):        --- Professional ---                           D62, Acute posthemorrhagic anemia                           K92.1, Melena (includes Hematochezia)                           K31.819, Angiodysplasia of stomach and duodenum                            without bleeding                           K29.00, Acute gastritis without bleeding                           K44.9, Diaphragmatic hernia without obstruction or  gangrene CPT copyright 2017 [employer] Association. All rights reserved. The codes documented in this report are preliminary and upon coder review may  be revised to meet current compliance requirements. Lear Ng, MD 09/14/2017 2:18:37 PM This report has been signed electronically. Number of Addenda: 0

## 2017-09-14 NOTE — Progress Notes (Signed)
PROGRESS NOTE [facility]   Syndi [place]   RCV:[id number] DOB: [birth year]  DOA: 09/13/2017 PCP: Jani Gravel, MD   Brief Narrative:  Shirley [name] 84 year old female with medical history significant for hypertension, hypothyroidism and her murmur presented to the emergency department after being seen by PCP for generalized weakness.  She was referred to the ER after was found with hemoglobin of 5.5.  Patient very poor recent black tarry stool.  Never had endoscopy or colonoscopy.  Denies NSAIDs use.  Upon ED evaluation hemoglobin 5.5, hypotensive and tachycardic.  Patient was admitted with working diagnosis of GI bleed.  Gastroenterology was consulted and patient was started on Protonix drip,and was transfused 2 units of PRBCs.  Subjective: Patient seen and examined, she has no complaints today.  Feeling more energetic today.  Denies chest pain, shortness of breath and palpitations  Assessment & Plan:  Acute blood loss anemia due to suspected upper GI bleed GI was consulted and EGD was performed showing duodenal AVM (no active bleeding) these were cauterized.  Patient is a status post 2 units of PRBC with good response of hemoglobin.  Per GI continue IV Protonix and may switch to p.o. formulation in a.m.  Continue to monitor CBC and transfuse if hemoglobin less than 7.  Systolic murmur Suspect aortic stenosis, echocardiogram pending Percent this has been chronic for over 2 years. Cardiology consulted  QTC prolongation QTC 549 on admission decreased to 474 EKG shows right bundle branch block and possible repo Patient denies chest pain, and troponins are negative. Avoid QTC prolonging agents  Hypertension Initially hypotensive due to blood loss, now BP stable after transfusion and fluid resuscitation. Patient is not on any antihypertensive.  Continue to monitor closely  CKD stage III Creatinine seems to be at baseline Check BMP in a.m.  Hypothyroidism    Continue current regimen  DVT prophylaxis: SCDs Code Status: DNR Family Communication: None at bedside Disposition Plan: Home in 1-2 days  Consultants:   Cardiology  GI  Procedures:   EGD 4/18  Antimicrobials:  None   Objective: Vitals:   09/14/17 1048 09/14/17 1108 09/14/17 1418 09/14/17 1430  BP:  (!) 150/58 (!) 122/26 (!) 126/35  Pulse:  81 75 73  Resp:  15 14 16   Temp:  98.2 F (36.8 C) 98.5 F (36.9 C)   TempSrc:  Oral Oral   SpO2:  97% 96% 99%  Weight: 70.2 kg (154 lb 12.2 oz) 69.9 kg (154 lb)    Height:  5\' 4"  (1.626 m)      Intake/Output Summary (Last 24 hours) at 09/14/2017 1443 Last data filed at 09/14/2017 1411 Gross per 24 hour  Intake 2258.42 ml  Output -  Net 2258.42 ml   Filed Weights   09/13/17 1900 09/14/17 1048 09/14/17 1108  Weight: 31.1 kg (68 lb 9.6 oz) 70.2 kg (154 lb 12.2 oz) 69.9 kg (154 lb)    Examination:  General exam: Appears calm and comfortable, hard hearing HEENT: AC/AT, PERRLA, OP moist and clear Respiratory system: Clear to auscultation. No wheezes,crackle or rhonchi Cardiovascular system: Z0-C5, RRR, + systolic murmur 4/6 Gastrointestinal system: Abdomen is nondistended, soft and nontender.  Central nervous system: Alert and oriented.  Extremities: No pedal edema. Skin: No rashes, lesions or ulcers Psychiatry: Mood & affect appropriate.    Data Reviewed: I have personally reviewed following labs and imaging studies  CBC: Recent Labs  Lab 09/13/17 1609 09/14/17 0357  WBC 11.7* 9.8  HGB 5.5* 7.9*  HCT 17.3*  23.8*  MCV 101.8* 91.2  PLT 247 329   Basic Metabolic Panel: Recent Labs  Lab 09/13/17 1609 09/14/17 0357  NA 140 142  K 3.5 3.0*  CL 105 108  CO2 24 25  GLUCOSE 146* 110*  BUN 61* 59*  CREATININE 1.37* 1.29*  CALCIUM 8.4* 8.2*   GFR: Estimated Creatinine Clearance: 25.6 mL/min (A) (by C-G formula based on SCr of 1.29 mg/dL (H)). Liver Function Tests: Recent Labs  Lab 09/14/17 0357  AST  15  ALT 10*  ALKPHOS 47  BILITOT 0.6  PROT 4.4*  ALBUMIN 2.4*   No results for input(s): LIPASE, AMYLASE in the last 168 hours. No results for input(s): AMMONIA in the last 168 hours. Coagulation Profile: No results for input(s): INR, PROTIME in the last 168 hours. Cardiac Enzymes: No results for input(s): CKTOTAL, CKMB, CKMBINDEX, TROPONINI in the last 168 hours. BNP (last 3 results) No results for input(s): PROBNP in the last 8760 hours. HbA1C: No results for input(s): HGBA1C in the last 72 hours. CBG: No results for input(s): GLUCAP in the last 168 hours. Lipid Profile: No results for input(s): CHOL, HDL, LDLCALC, TRIG, CHOLHDL, LDLDIRECT in the last 72 hours. Thyroid Function Tests: No results for input(s): TSH, T4TOTAL, FREET4, T3FREE, THYROIDAB in the last 72 hours. Anemia Panel: No results for input(s): VITAMINB12, FOLATE, FERRITIN, TIBC, IRON, RETICCTPCT in the last 72 hours. Sepsis Labs: No results for input(s): PROCALCITON, LATICACIDVEN in the last 168 hours.  No results found for this or any previous visit (from the past 240 hour(s)).    Radiology Studies: No results found.    Scheduled Meds: . [MAR Hold] feeding supplement  1 Container Oral TID BM  . [MAR Hold] ferrous sulfate  325 mg Oral QPM  . [MAR Hold] levothyroxine  50 mcg Oral QAC breakfast  . [MAR Hold] simvastatin  40 mg Oral Daily   Continuous Infusions: . [MAR Hold] sodium chloride    . sodium chloride 75 mL/hr at 09/14/17 0520  . pantoprozole (PROTONIX) infusion 8 mg/hr (09/14/17 0613)     LOS: 1 day    Time spent: Total of 35 minutes spent with pt, greater than 50% of which was spent in discussion of  treatment, counseling and coordination of care   Chipper Oman, MD Pager: Text Page via [email or web address]   If 7PM-7AM, please contact night-coverage [email or web address] 09/14/2017, 2:43 PM   Note - This record has been created using [employer]. Chart creation errors have been sought, but may  not always have been located. Such creation errors do not reflect on the standard of medical care.

## 2017-09-14 NOTE — Anesthesia Preprocedure Evaluation (Addendum)
Anesthesia Evaluation  Patient identified by MRN, date of birth, ID band Patient awake    Reviewed: Allergy & Precautions, NPO status , Patient's Chart, lab work & pertinent test results  History of Anesthesia Complications Negative for: history of anesthetic complications  Airway Mallampati: II  TM Distance: >3 FB Neck ROM: Full    Dental  (+) Edentulous Upper, Edentulous Lower   Pulmonary former smoker,    breath sounds clear to auscultation       Cardiovascular hypertension, Pt. on home beta blockers and Pt. on medications (-) anginaDOE: new onset afib.  + dysrhythmias + Valvular Problems/Murmurs (murmur, ECHO today shows severe AS)  Rhythm:Irregular Rate:Normal + Systolic murmurs Prelim ECHO results today: mild LVH, with normal LV function, severe aortic stenosis, peak grad 86 mmHg, mean grad 56 mmHg, AVA (VTI) 0.4 cm2   Neuro/Psych negative neurological ROS     GI/Hepatic Neg liver ROS, GERD  Medicated and Controlled,  Endo/Other  Hypothyroidism   Renal/GU Renal InsufficiencyRenal disease (creat 1.29)     Musculoskeletal   Abdominal   Peds  Hematology  (+) Blood dyscrasia (Hb 7.9), anemia ,   Anesthesia Other Findings   Reproductive/Obstetrics                           Anesthesia Physical Anesthesia Plan  ASA: IV  Anesthesia Plan: MAC   Post-op Pain Management:    Induction:   PONV Risk Score and Plan: 2 and Treatment may vary due to age or medical condition  Airway Management Planned: Natural Airway and Nasal Cannula  Additional Equipment: Arterial line  Intra-op Plan:   Post-operative Plan:   Informed Consent: I have reviewed the patients History and Physical, chart, labs and discussed the procedure including the risks, benefits and alternatives for the proposed anesthesia with the patient or authorized representative who has indicated his/her understanding and  acceptance.   Consent reviewed with POA  Plan Discussed with: Surgeon and CRNA  Anesthesia Plan Comments: (Plan routine monitors, A-line, MAC Son understands DNR suspended peri-op, resume post op)        Anesthesia Quick Evaluation

## 2017-09-14 NOTE — Anesthesia Procedure Notes (Signed)
Arterial Line Insertion Start/End4/18/2019 1:17 PM, 09/14/2017 1:32 PM Performed by: Annye Asa, MD, anesthesiologist, attending  Patient location: Pre-op. Preanesthetic checklist: patient identified, IV checked, risks and benefits discussed, surgical consent, monitors and equipment checked, pre-op evaluation, timeout performed and anesthesia consent Right, radial was placed Catheter size: 20 G Hand hygiene performed , maximum sterile barriers used  and Seldinger technique used Allen's test indicative of satisfactory collateral circulation Attempts: 4 Procedure performed without using ultrasound guided technique. Following insertion, dressing applied and Biopatch. Patient tolerated the procedure well with no immediate complications.

## 2017-09-14 NOTE — Progress Notes (Signed)
  Echocardiogram 2D Echocardiogram has been performed.  Merrie Roof F 09/14/2017, 1:13 PM

## 2017-09-14 NOTE — Progress Notes (Signed)
Nutrition Brief Note  Patient identified on the Malnutrition Screening Tool (MST) Report  Wt Readings from Last 15 Encounters:  09/14/17 154 lb (69.9 kg)   Spoke with pt at bedside. Denies having loss in appetite PTA. States her son cooks her meals that contain meat/vegetables and she enjoys them. Pt endorses a UBW of 150 lb, denies any recent weight loss. Does not wish to have supplementation at this time.    Body mass index is 26.43 kg/m. Patient meets criteria for overweight based on current BMI.   Labs and medications reviewed.   No nutrition interventions warranted at this time. If nutrition issues arise, please consult RD.   Shirley [name] RD, LDN Clinical Nutrition Pager # [phone]

## 2017-09-14 NOTE — Consult Note (Signed)
Referring Provider: Dr. Nevada Crane Primary Care Physician:  Jani Gravel, MD Primary Gastroenterologist:  Althia Forts  Reason for Consultation:  GI bleed; Anemia (consult called by Dr. Nevada Crane today; ER doctor did not consult [facility] yesterday)  HPI: Shirley [name] is a 84 y.o. female admitted for 2 week history of black tarry stools and weakness and found to be severely anemic with a Hgb 5.5. Denies red blood per rectum and denies hematemesis, N/V, heartburn, abdominal pain, or dizziness. Son is at her bedside and gives most of the history and states that this is the first time she has been admitted to the hospital in her life. Denies NSAIDs, alcohol. No past history of GI bleeding or known ulcers. Hgb 7.9 following 2 U PRBCs. BP in the low 814'G systolic and short episode of tachycardia noted in vitals flowsheet that improved with volume. No black stools since admit. Has never had a colonoscopy or EGD.  Past Medical History:  Diagnosis Date  . Gout   . Hypercholesteremia   . Hypertension   . Thyroid disease    hypothyroid    Past Surgical History:  Procedure Laterality Date  . APPENDECTOMY      Prior to Admission medications   Medication Sig Start Date End Date Taking? Authorizing Provider  acetaminophen (TYLENOL) 325 MG tablet Take 650 mg by mouth every 6 (six) hours as needed for moderate pain.   Yes [provider]  allopurinol (ZYLOPRIM) 300 MG tablet Take 300 mg by mouth daily.   Yes [provider]  carvedilol (COREG) 12.5 MG tablet Take 12.5 mg by mouth 2 (two) times daily with a meal.   Yes [provider]  cholecalciferol (VITAMIN D) 1000 units tablet Take 1,000 Units by mouth daily.   Yes [provider]  docusate sodium (COLACE) 250 MG capsule Take 250 mg by mouth 2 (two) times daily.   Yes [provider]  ferrous sulfate 325 (65 FE) MG EC tablet Take 325 mg by mouth every evening.   Yes [provider]  furosemide (LASIX)  40 MG tablet Take 60 mg by mouth daily.   Yes [provider]  levothyroxine (SYNTHROID, LEVOTHROID) 50 MCG tablet Take 50 mcg by mouth daily.   Yes [provider]  lisinopril (PRINIVIL,ZESTRIL) 20 MG tablet Take 20 mg by mouth 2 (two) times daily.   Yes [provider]  meclizine (ANTIVERT) 25 MG tablet Take 25 mg by mouth 3 (three) times daily as needed for dizziness.   Yes [provider]  mupirocin ointment (BACTROBAN) 2 % Apply 1 application topically 3 (three) times daily.  08/25/17  Yes [provider]  omeprazole (PRILOSEC) 20 MG capsule Take 20 mg by mouth daily.   Yes [provider]  polyethylene glycol (MIRALAX / GLYCOLAX) packet Take 17 g by mouth daily as needed for moderate constipation.   Yes [provider]  simvastatin (ZOCOR) 40 MG tablet Take 40 mg by mouth daily.   Yes [provider]  traMADol (ULTRAM) 50 MG tablet Take 50 mg by mouth 2 (two) times daily.   Yes [provider]  cephALEXin (KEFLEX) 500 MG capsule Take 1 capsule (500 mg total) by mouth 3 (three) times daily. Patient not taking: Reported on 01/14/5630 49/7/02   Delora Fuel, MD  ondansetron (ZOFRAN) 4 MG tablet Take 1 tablet (4 mg total) by mouth every 8 (eight) hours as needed for nausea or vomiting. Patient not taking: Reported on 09/13/2017 05/06/17  Delora Fuel, MD    Scheduled Meds: . feeding supplement  1 Container Oral TID BM  . ferrous sulfate  325 mg Oral QPM  . levothyroxine  50 mcg Oral QAC breakfast  . simvastatin  40 mg Oral Daily   Continuous Infusions: . sodium chloride    . sodium chloride 75 mL/hr at 09/14/17 0520  . pantoprozole (PROTONIX) infusion 8 mg/hr (09/14/17 5277)   PRN Meds:.meclizine  Allergies as of 09/13/2017  . (No Known Allergies)    No family history on file.  Social History   Socioeconomic History  . Marital status: Widowed    Spouse name: Not on file  . Number of children: Not on  file  . Years of education: Not on file  . Highest education level: Not on file  Occupational History  . Not on file  Social Needs  . Financial resource strain: Not on file  . Food insecurity:    Worry: Not on file    Inability: Not on file  . Transportation needs:    Medical: Not on file    Non-medical: Not on file  Tobacco Use  . Smoking status: Former Research scientist (life sciences)  . Smokeless tobacco: Never Used  Substance and Sexual Activity  . Alcohol use: No    Frequency: Never  . Drug use: No  . Sexual activity: Not on file  Lifestyle  . Physical activity:    Days per week: Not on file    Minutes per session: Not on file  . Stress: Not on file  Relationships  . Social connections:    Talks on phone: Not on file    Gets together: Not on file    Attends religious service: Not on file    Active member of club or organization: Not on file    Attends meetings of clubs or organizations: Not on file    Relationship status: Not on file  . Intimate partner violence:    Fear of current or ex partner: Not on file    Emotionally abused: Not on file    Physically abused: Not on file    Forced sexual activity: Not on file  Other Topics Concern  . Not on file  Social History Narrative  . Not on file    Review of Systems: All negative except as stated above in HPI.  Physical Exam: Vital signs: Vitals:   09/14/17 0139 09/14/17 0527  BP: (!) 117/50 (!) 147/49  Pulse: 75 72  Resp: 18 18  Temp: 98.1 F (36.7 C) 98 F (36.7 C)  SpO2: 98% 100%     General:  Lethargic, elderly, well-nourished, pleasant and cooperative in NAD Head: normocephalic, atraumatic Eyes: anicteric sclera ENT: oropharynx clear Neck: supple, nontender Lungs:  Clear throughout to auscultation.   No wheezes, crackles, or rhonchi. No acute distress. Heart:  Regular rate and rhythm; no murmurs, clicks, rubs,  or gallops. Abdomen: minimal suprapubic tenderness without guarding, otherwise nontender, soft, nondistended,  +BS  Rectal:  Deferred Ext: hyperpigmentation of LE, no edema  GI:  Lab Results: Recent Labs    09/13/17 1609 09/14/17 0357  WBC 11.7* 9.8  HGB 5.5* 7.9*  HCT 17.3* 23.8*  PLT 247 198   BMET Recent Labs    09/13/17 1609 09/14/17 0357  NA 140 142  K 3.5 3.0*  CL 105 108  CO2 24 25  GLUCOSE 146* 110*  BUN 61* 59*  CREATININE 1.37* 1.29*  CALCIUM 8.4* 8.2*   LFT Recent Labs  09/14/17 0357  PROT 4.4*  ALBUMIN 2.4*  AST 15  ALT 10*  ALKPHOS 47  BILITOT 0.6   PT/INR No results for input(s): LABPROT, INR in the last 72 hours.   Studies/Results: No results found.  Impression/Plan: GI bleed with melena and severe anemia Hgb 5.5 concerning for an upper GI bleed such as a peptic ulcer vs malignancy. Most likely has an ulcer that recently bled. Hemodynamically stable. NPO. Continue Protonix drip. EGD today. Risks/benefits of EGD discussed with patient and her son who is HCPOA and they agree to proceed. If EGD is unrevealing, then will manage conservatively and not plan to do a colonoscopy unless melena continues and then may need to workup for a right-sided colonic source. Small bowel AVMs also possible. If EGD unrevealing, then consider an abd CT scan to look for other GI malignancies.    LOS: 1 day   Dexter C.  09/14/2017, 10:28 AM  Questions please call [phone]

## 2017-09-14 NOTE — Consult Note (Signed)
Cardiology Consultation:   Patient ID: Shirley [name]; [id number]; September 17, 1921   Admit date: 09/13/2017 Date of Consult: 09/14/2017  Primary Care Provider: Jani Gravel, MD Primary Cardiologist: Skeet Latch, MD new Primary Electrophysiologist:  NA   Patient Profile:   Shirley [name] is a 84 y.o. female with a hx of HTN, Heart murmur, hypothyroidism who is being seen today for the evaluation of severe AS found on admit for acute anemia at the request of Dr. Quincy [name].  History of Present Illness:   Shirley [name] has a hx of HTN, Heart murmur, hypothyroidism and was admitted yesterday 09/13/17 with weakness, black tarry stools with Hgb on admit of 5.5.  She was transfused 2 units PRBCs.   EGD today with gastritis and 2 lesions ablated.    Labs today H/H 7.9/23.8  WBC 9.8  plts 198 K+ 3.0, Cr 1.29,   EKG chronic RBBB, a fib rate controlled no ST changes  EKGs from 04/2017 with SR and 1st degree AV block though P wave is difficult to see at times.  All personally reviewed.    Tele Personally reviewed currently SR with PACs, other times a fib.    CT of chest 04/2017 with severe coronary, aortic valvular and mitral annular calcification.  Also severe stenosis of Lt subclavian artery     BP today 152/44  Currently feels well.  No chest pain and no SOB.  She knew she had a heart murmur for at least 30 years.  She is legally blind and has finger numbness so she does not do much activity.  Her son cooks and cleans.  She may walk outside for short periods of time but that is her most activity.  No SOB with this.  She was doing well until the GI bleed.  She then did become SOB and weak.    Past Medical History:  Diagnosis Date  . Gout   . Heart murmur   . Hypercholesteremia   . Hypertension   . Thyroid disease    hypothyroid    Past Surgical History:  Procedure Laterality Date  . APPENDECTOMY       Home Medications:  Prior to Admission medications   Medication Sig  Start Date End Date Taking? Authorizing Provider  acetaminophen (TYLENOL) 325 MG tablet Take 650 mg by mouth every 6 (six) hours as needed for moderate pain.   Yes [provider]  allopurinol (ZYLOPRIM) 300 MG tablet Take 300 mg by mouth daily.   Yes [provider]  carvedilol (COREG) 12.5 MG tablet Take 12.5 mg by mouth 2 (two) times daily with a meal.   Yes [provider]  cholecalciferol (VITAMIN D) 1000 units tablet Take 1,000 Units by mouth daily.   Yes [provider]  docusate sodium (COLACE) 250 MG capsule Take 250 mg by mouth 2 (two) times daily.   Yes [provider]  ferrous sulfate 325 (65 FE) MG EC tablet Take 325 mg by mouth every evening.   Yes [provider]  furosemide (LASIX) 40 MG tablet Take 60 mg by mouth daily.   Yes [provider]  levothyroxine (SYNTHROID, LEVOTHROID) 50 MCG tablet Take 50 mcg by mouth daily.   Yes [provider]  lisinopril (PRINIVIL,ZESTRIL) 20 MG tablet Take 20 mg by mouth 2 (two) times daily.   Yes [provider]  meclizine (ANTIVERT) 25 MG tablet Take 25 mg by mouth 3 (three) times daily as needed for dizziness.   Yes [provider]  mupirocin ointment (BACTROBAN) 2 % Apply 1 application topically 3 (three) times daily.  08/25/17  Yes [provider]  omeprazole (PRILOSEC) 20 MG capsule Take 20 mg by mouth daily.   Yes [provider]  polyethylene glycol (MIRALAX / GLYCOLAX) packet Take 17 g by mouth daily as needed for moderate constipation.   Yes [provider]  simvastatin (ZOCOR) 40 MG tablet Take 40 mg by mouth daily.   Yes [provider]  traMADol (ULTRAM) 50 MG tablet Take 50 mg by mouth 2 (two) times daily.   Yes [provider]  cephALEXin (KEFLEX) 500 MG capsule Take 1 capsule (500 mg total) by mouth 3 (three) times daily. Patient not taking: Reported on 1/96/2229 79/8/92   Delora Fuel, MD    ondansetron (ZOFRAN) 4 MG tablet Take 1 tablet (4 mg total) by mouth every 8 (eight) hours as needed for nausea or vomiting. Patient not taking: Reported on 06/17/4172 12/29/42   Delora Fuel, MD    Inpatient Medications: Scheduled Meds: . feeding supplement  1 Container Oral TID BM  . ferrous sulfate  325 mg Oral QPM  . levothyroxine  50 mcg Oral QAC breakfast  . simvastatin  40 mg Oral Daily   Continuous Infusions: . sodium chloride    . sodium chloride 75 mL/hr at 09/14/17 1535  . pantoprozole (PROTONIX) infusion 8 mg/hr (09/14/17 1538)   PRN Meds: meclizine  Allergies:   No Known Allergies  Social History:   Social History   Socioeconomic History  . Marital status: Widowed    Spouse name: Not on file  . Number of children: Not on file  . Years of education: Not on file  . Highest education level: Not on file  Occupational History  . Not on file  Social Needs  . Financial resource strain: Not on file  . Food insecurity:    Worry: Not on file    Inability: Not on file  . Transportation needs:    Medical: Not on file    Non-medical: Not on file  Tobacco Use  . Smoking status: Former Research scientist (life sciences)  . Smokeless tobacco: Never Used  Substance and Sexual Activity  . Alcohol use: No    Frequency: Never  . Drug use: No  . Sexual activity: Not on file  Lifestyle  . Physical activity:    Days per week: Not on file    Minutes per session: Not on file  . Stress: Not on file  Relationships  . Social connections:    Talks on phone: Not on file    Gets together: Not on file    Attends religious service: Not on file    Active member of club or organization: Not on file    Attends meetings of clubs or organizations: Not on file    Relationship status: Not on file  . Intimate partner violence:    Fear of current or ex partner: Not on file    Emotionally abused: Not on file    Physically abused: Not on file    Forced sexual activity: Not on file  Other Topics Concern  .  Not on file  Social History Narrative  . Not on file    Family History:    Family History  Problem Relation Age of Onset  . Cancer Mother   . Cancer Father   . Heart disease Brother      ROS:  Please see the history of present illness.  General:no colds or fevers,  no weight changes Skin:no rashes or ulcers HEENT:+ legally blind, no congestion CV:see HPI PUL:see HPI GI:no diarrhea constipation + melena, no indigestion GU:no hematuria, no dysuria MS:no joint pain, no claudication Neuro:no syncope, no lightheadedness Endo:no diabetes, + thyroid disease  All other ROS reviewed and negative.     Physical Exam/Data:   Vitals:   09/14/17 1440 09/14/17 1450 09/14/17 1500 09/14/17 1529  BP: (!) 158/45 (!) 168/54 (!) 152/44 (!) 131/44  Pulse: 77 75 69 83  Resp: 14 17 (!) 22   Temp:    98.6 F (37 C)  TempSrc:    Oral  SpO2: 100% 100% 98% 97%  Weight:      Height:        Intake/Output Summary (Last 24 hours) at 09/14/2017 1558 Last data filed at 09/14/2017 1411 Gross per 24 hour  Intake 2258.42 ml  Output -  Net 2258.42 ml   Filed Weights   09/13/17 1900 09/14/17 1048 09/14/17 1108  Weight: 68 lb 9.6 oz (31.1 kg) 154 lb 12.2 oz (70.2 kg) 154 lb (69.9 kg)   Body mass index is 26.43 kg/m.  General:  Well nourished, well developed, in no acute distress today back from EGD HEENT: hard of hearing and legally blind Lymph: no adenopathy Neck: no JVD Endocrine:  No thryomegaly Vascular: + carotid bruits bil; pedal pulses 1+ bilaterally Cardiac:  normal S1, S2; RRR- currently ; 3-2/3 systolic murmur with radiation to her back, abd and carotids  Lungs:  clear to auscultation bilaterally, no wheezing, rhonchi or rales  Abd: soft, nontender, no hepatomegaly  Ext: no edema Musculoskeletal:  No deformities, BUE and BLE strength normal and equal Skin: warm and dry, brisk capillary refill  Neuro: alert and oriented, follows commands, MAE, no focal abnormalities noted Psych:   Normal affect    Relevant CV Studies: Echo 09/14/17  Study Conclusions  - Left ventricle: The cavity size was normal. There was moderate   concentric hypertrophy. Systolic function was vigorous. The   estimated ejection fraction was in the range of 65% to 70%. Wall   motion was normal; there were no regional wall motion   abnormalities. There was fusion of early and atrial contributions   to ventricular filling. - Aortic valve: There was severe stenosis. Valve area (VTI): 1.04   cm^2. Valve area (Vmax): 0.83 cm^2. Valve area (Vmean): 0.8 cm^2. - Mitral valve: Severely calcified annulus. The findings are   consistent with mild stenosis. There was mild to moderate   regurgitation directed centrally. Valve area by continuity   equation (using LVOT flow): 2.56 cm^2. - Left atrium: The atrium was mildly dilated. - Pericardium, extracardiac: A trivial pericardial effusion was   identified posterior to the heart.   Laboratory Data:  Chemistry Recent Labs  Lab 09/13/17 1609 09/14/17 0357  NA 140 142  K 3.5 3.0*  CL 105 108  CO2 24 25  GLUCOSE 146* 110*  BUN 61* 59*  CREATININE 1.37* 1.29*  CALCIUM 8.4* 8.2*  GFRNONAA 32* 34*  GFRAA 37* 40*  ANIONGAP 11 9    Recent Labs  Lab 09/14/17 0357  PROT 4.4*  ALBUMIN 2.4*  AST 15  ALT 10*  ALKPHOS 47  BILITOT 0.6   Hematology Recent Labs  Lab 09/13/17 1609 09/14/17 0357  WBC 11.7* 9.8  RBC 1.70* 2.61*  HGB 5.5* 7.9*  HCT 17.3* 23.8*  MCV 101.8* 91.2  MCH 32.4 30.3  MCHC 31.8 33.2  RDW 20.9* 20.0*  PLT 247 198  Cardiac EnzymesNo results for input(s): TROPONINI in the last 168 hours. No results for input(s): TROPIPOC in the last 168 hours.  BNPNo results for input(s): BNP, PROBNP in the last 168 hours.  DDimer No results for input(s): DDIMER in the last 168 hours.  Radiology/Studies:  No results found.  Assessment and Plan:   1. Severe AS has been asymptomatic until drop in Hgb - with severe AS and possible  need for colonoscopy capsule endoscopy may be better choice for pt.   Once stable from GI bleed we will have her seen in [facility] clinic.    2.         Acute GI bleed -- just back from endoscopy + gastritis, Two tiny nonbleeding duodenal AVMs fulgurated with argon plasma coagulation (APC) though Dr. Michail Sermon doubted GI bleed was from this source.  On Protonix drip.  If continued bleeding he would like to do colonoscopy or capsule endoscopy.  Has rec'd 2 units PRBCs.  3.        Atrial fib new at times she is in SR with PACS and other a fib mostly rate controlled.  CHA2DS2VASC score of 4 at least but with GI bleed not a current candidate for anticoagulation.  4.        Chronic RBBB at least from 04/2017.    5.         BP elevated but she has not been treated with medication in past.  Currently 131/44 but has been 150/58  6.        CKD -3 stable.   7.         Hypokalemia was 3.0 this AM do not see that it was replaced.  Will check now.     For questions or updates, please contact [place] Please consult [email or web address] for contact info under Cardiology/STEMI.   Signed, Cecilie Kicks, NP  09/14/2017 3:58 PM

## 2017-09-14 NOTE — Anesthesia Postprocedure Evaluation (Signed)
Anesthesia Post Note  Patient: Ellieanna Ann Perrell  Procedure(s) Performed: ESOPHAGOGASTRODUODENOSCOPY (EGD) WITH PROPOFOL (N/A )     Patient location during evaluation: Endoscopy Anesthesia Type: MAC Level of consciousness: awake and alert, oriented and patient cooperative Pain management: pain level controlled Vital Signs Assessment: post-procedure vital signs reviewed and stable Respiratory status: spontaneous breathing, nonlabored ventilation and respiratory function stable Cardiovascular status: blood pressure returned to baseline and stable Postop Assessment: no apparent nausea or vomiting Anesthetic complications: no    Last Vitals:  Vitals:   09/14/17 1108 09/14/17 1418  BP: (!) 150/58 (!) (P) 122/26  Pulse: 81 75  Resp: 15 14  Temp: 36.8 C 36.9 C  SpO2: 97% 96%    Last Pain:  Vitals:   09/14/17 1418  TempSrc: Oral  PainSc: 0-No pain                 Loriana Samad,E. Ewald Beg

## 2017-09-14 NOTE — Transfer of Care (Signed)
Immediate Anesthesia Transfer of Care Note  Patient: Shirley [name]  Procedure(s) Performed: ESOPHAGOGASTRODUODENOSCOPY (EGD) WITH PROPOFOL (N/A )  Patient Location: PACU and Endoscopy Unit  Anesthesia Type:MAC  Level of Consciousness: awake and patient cooperative  Airway & Oxygen Therapy: Patient Spontanous Breathing and Patient connected to nasal cannula oxygen  Post-op Assessment: Report given to RN and Post -op Vital signs reviewed and stable  Post vital signs: Reviewed and stable  Last Vitals:  Vitals Value Taken Time  BP 122/26 09/14/2017  2:15 PM  Temp    Pulse    Resp 17 09/14/2017  2:18 PM  SpO2    Vitals shown include unvalidated device data.  Last Pain:  Vitals:   09/14/17 1144  TempSrc:   PainSc: 0-No pain      Patients Stated Pain Goal: Other (Comment) (94/58/59 2924)  Complications: No apparent anesthesia complications

## 2017-09-14 NOTE — Interval H&P Note (Signed)
History and Physical Interval Note:  09/14/2017 1:46 PM  Shirley [name]  has presented today for surgery, with the diagnosis of Anemia; Heme positive stool  The various methods of treatment have been discussed with the patient and family. After consideration of risks, benefits and other options for treatment, the patient has consented to  Procedure(s): ESOPHAGOGASTRODUODENOSCOPY (EGD) WITH PROPOFOL (N/A) as a surgical intervention .  The patient's history has been reviewed, patient examined, no change in status, stable for surgery.  I have reviewed the patient's chart and labs.  Questions were answered to the patient's satisfaction.     Orient C.

## 2017-09-14 NOTE — H&P (View-Only) (Signed)
Referring Provider: Dr. Nevada Crane Primary Care Physician:  Jani Gravel, MD Primary Gastroenterologist:  Althia Forts  Reason for Consultation:  GI bleed; Anemia (consult called by Dr. Nevada Crane today; ER doctor did not consult [facility] yesterday)  HPI: Shirley [name] is a 84 y.o. female admitted for 2 week history of black tarry stools and weakness and found to be severely anemic with a Hgb 5.5. Denies red blood per rectum and denies hematemesis, N/V, heartburn, abdominal pain, or dizziness. Son is at her bedside and gives most of the history and states that this is the first time she has been admitted to the hospital in her life. Denies NSAIDs, alcohol. No past history of GI bleeding or known ulcers. Hgb 7.9 following 2 U PRBCs. BP in the low 732'K systolic and short episode of tachycardia noted in vitals flowsheet that improved with volume. No black stools since admit. Has never had a colonoscopy or EGD.  Past Medical History:  Diagnosis Date  . Gout   . Hypercholesteremia   . Hypertension   . Thyroid disease    hypothyroid    Past Surgical History:  Procedure Laterality Date  . APPENDECTOMY      Prior to Admission medications   Medication Sig Start Date End Date Taking? Authorizing Provider  acetaminophen (TYLENOL) 325 MG tablet Take 650 mg by mouth every 6 (six) hours as needed for moderate pain.   Yes [provider]  allopurinol (ZYLOPRIM) 300 MG tablet Take 300 mg by mouth daily.   Yes [provider]  carvedilol (COREG) 12.5 MG tablet Take 12.5 mg by mouth 2 (two) times daily with a meal.   Yes [provider]  cholecalciferol (VITAMIN D) 1000 units tablet Take 1,000 Units by mouth daily.   Yes [provider]  docusate sodium (COLACE) 250 MG capsule Take 250 mg by mouth 2 (two) times daily.   Yes [provider]  ferrous sulfate 325 (65 FE) MG EC tablet Take 325 mg by mouth every evening.   Yes [provider]  furosemide (LASIX)  40 MG tablet Take 60 mg by mouth daily.   Yes [provider]  levothyroxine (SYNTHROID, LEVOTHROID) 50 MCG tablet Take 50 mcg by mouth daily.   Yes [provider]  lisinopril (PRINIVIL,ZESTRIL) 20 MG tablet Take 20 mg by mouth 2 (two) times daily.   Yes [provider]  meclizine (ANTIVERT) 25 MG tablet Take 25 mg by mouth 3 (three) times daily as needed for dizziness.   Yes [provider]  mupirocin ointment (BACTROBAN) 2 % Apply 1 application topically 3 (three) times daily.  08/25/17  Yes [provider]  omeprazole (PRILOSEC) 20 MG capsule Take 20 mg by mouth daily.   Yes [provider]  polyethylene glycol (MIRALAX / GLYCOLAX) packet Take 17 g by mouth daily as needed for moderate constipation.   Yes [provider]  simvastatin (ZOCOR) 40 MG tablet Take 40 mg by mouth daily.   Yes [provider]  traMADol (ULTRAM) 50 MG tablet Take 50 mg by mouth 2 (two) times daily.   Yes [provider]  cephALEXin (KEFLEX) 500 MG capsule Take 1 capsule (500 mg total) by mouth 3 (three) times daily. Patient not taking: Reported on 0/25/4270 62/3/76   Delora Fuel, MD  ondansetron (ZOFRAN) 4 MG tablet Take 1 tablet (4 mg total) by mouth every 8 (eight) hours as needed for nausea or vomiting. Patient not taking: Reported on 09/13/2017 05/06/17  Delora Fuel, MD    Scheduled Meds: . feeding supplement  1 Container Oral TID BM  . ferrous sulfate  325 mg Oral QPM  . levothyroxine  50 mcg Oral QAC breakfast  . simvastatin  40 mg Oral Daily   Continuous Infusions: . sodium chloride    . sodium chloride 75 mL/hr at 09/14/17 0520  . pantoprozole (PROTONIX) infusion 8 mg/hr (09/14/17 7619)   PRN Meds:.meclizine  Allergies as of 09/13/2017  . (No Known Allergies)    No family history on file.  Social History   Socioeconomic History  . Marital status: Widowed    Spouse name: Not on file  . Number of children: Not on  file  . Years of education: Not on file  . Highest education level: Not on file  Occupational History  . Not on file  Social Needs  . Financial resource strain: Not on file  . Food insecurity:    Worry: Not on file    Inability: Not on file  . Transportation needs:    Medical: Not on file    Non-medical: Not on file  Tobacco Use  . Smoking status: Former Research scientist (life sciences)  . Smokeless tobacco: Never Used  Substance and Sexual Activity  . Alcohol use: No    Frequency: Never  . Drug use: No  . Sexual activity: Not on file  Lifestyle  . Physical activity:    Days per week: Not on file    Minutes per session: Not on file  . Stress: Not on file  Relationships  . Social connections:    Talks on phone: Not on file    Gets together: Not on file    Attends religious service: Not on file    Active member of club or organization: Not on file    Attends meetings of clubs or organizations: Not on file    Relationship status: Not on file  . Intimate partner violence:    Fear of current or ex partner: Not on file    Emotionally abused: Not on file    Physically abused: Not on file    Forced sexual activity: Not on file  Other Topics Concern  . Not on file  Social History Narrative  . Not on file    Review of Systems: All negative except as stated above in HPI.  Physical Exam: Vital signs: Vitals:   09/14/17 0139 09/14/17 0527  BP: (!) 117/50 (!) 147/49  Pulse: 75 72  Resp: 18 18  Temp: 98.1 F (36.7 C) 98 F (36.7 C)  SpO2: 98% 100%     General:  Lethargic, elderly, well-nourished, pleasant and cooperative in NAD Head: normocephalic, atraumatic Eyes: anicteric sclera ENT: oropharynx clear Neck: supple, nontender Lungs:  Clear throughout to auscultation.   No wheezes, crackles, or rhonchi. No acute distress. Heart:  Regular rate and rhythm; no murmurs, clicks, rubs,  or gallops. Abdomen: minimal suprapubic tenderness without guarding, otherwise nontender, soft, nondistended,  +BS  Rectal:  Deferred Ext: hyperpigmentation of LE, no edema  GI:  Lab Results: Recent Labs    09/13/17 1609 09/14/17 0357  WBC 11.7* 9.8  HGB 5.5* 7.9*  HCT 17.3* 23.8*  PLT 247 198   BMET Recent Labs    09/13/17 1609 09/14/17 0357  NA 140 142  K 3.5 3.0*  CL 105 108  CO2 24 25  GLUCOSE 146* 110*  BUN 61* 59*  CREATININE 1.37* 1.29*  CALCIUM 8.4* 8.2*   LFT Recent Labs  09/14/17 0357  PROT 4.4*  ALBUMIN 2.4*  AST 15  ALT 10*  ALKPHOS 47  BILITOT 0.6   PT/INR No results for input(s): LABPROT, INR in the last 72 hours.   Studies/Results: No results found.  Impression/Plan: GI bleed with melena and severe anemia Hgb 5.5 concerning for an upper GI bleed such as a peptic ulcer vs malignancy. Most likely has an ulcer that recently bled. Hemodynamically stable. NPO. Continue Protonix drip. EGD today. Risks/benefits of EGD discussed with patient and her son who is HCPOA and they agree to proceed. If EGD is unrevealing, then will manage conservatively and not plan to do a colonoscopy unless melena continues and then may need to workup for a right-sided colonic source. Small bowel AVMs also possible. If EGD unrevealing, then consider an abd CT scan to look for other GI malignancies.    LOS: 1 day   [place] C.  09/14/2017, 10:28 AM  Questions please call [phone]

## 2017-09-15 ENCOUNTER — Inpatient Hospital Stay (HOSPITAL_COMMUNITY): Payer: Medicare Other

## 2017-09-15 ENCOUNTER — Encounter (HOSPITAL_COMMUNITY): Payer: Self-pay | Admitting: Gastroenterology

## 2017-09-15 LAB — BASIC METABOLIC PANEL
Anion gap: 7 (ref 5–15)
BUN: 39 mg/dL — ABNORMAL HIGH (ref 6–20)
CALCIUM: 8 mg/dL — AB (ref 8.9–10.3)
CO2: 22 mmol/L (ref 22–32)
Chloride: 112 mmol/L — ABNORMAL HIGH (ref 101–111)
Creatinine, Ser: 1.1 mg/dL — ABNORMAL HIGH (ref 0.44–1.00)
GFR, EST AFRICAN AMERICAN: 48 mL/min — AB (ref >60)
GFR, EST NON AFRICAN AMERICAN: 42 mL/min — AB (ref >60)
GLUCOSE: 97 mg/dL (ref 65–99)
Potassium: 3.6 mmol/L (ref 3.5–5.1)
SODIUM: 141 mmol/L (ref 135–145)

## 2017-09-15 LAB — CBC
HCT: 21.5 % — ABNORMAL LOW (ref 36.0–46.0)
HEMOGLOBIN: 6.8 g/dL — AB (ref 12.0–15.0)
MCH: 29.6 pg (ref 26.0–34.0)
MCHC: 31.6 g/dL (ref 30.0–36.0)
MCV: 93.5 fL (ref 78.0–100.0)
Platelets: 204 10*3/uL (ref 150–400)
RBC: 2.3 MIL/uL — ABNORMAL LOW (ref 3.87–5.11)
RDW: 21 % — ABNORMAL HIGH (ref 11.5–15.5)
WBC: 8 10*3/uL (ref 4.0–10.5)

## 2017-09-15 LAB — MAGNESIUM: MAGNESIUM: 1.9 mg/dL (ref 1.7–2.4)

## 2017-09-15 LAB — PREPARE RBC (CROSSMATCH)

## 2017-09-15 LAB — OCCULT BLOOD X 1 CARD TO LAB, STOOL: Fecal Occult Bld: POSITIVE — AB

## 2017-09-15 MED ORDER — PANTOPRAZOLE SODIUM 40 MG PO TBEC
40.0000 mg | DELAYED_RELEASE_TABLET | Freq: Every day | ORAL | Status: DC
  Administered 2017-09-15 – 2017-09-26 (×12): 40 mg via ORAL
  Filled 2017-09-15 (×12): qty 1

## 2017-09-15 MED ORDER — HALOPERIDOL LACTATE 5 MG/ML IJ SOLN
5.0000 mg | Freq: Four times a day (QID) | INTRAMUSCULAR | Status: DC | PRN
  Administered 2017-09-16 (×2): 5 mg via INTRAVENOUS
  Filled 2017-09-15 (×2): qty 1

## 2017-09-15 MED ORDER — SODIUM CHLORIDE 0.9 % IV SOLN
Freq: Once | INTRAVENOUS | Status: AC
  Administered 2017-09-15: 11:00:00 via INTRAVENOUS

## 2017-09-15 MED ORDER — ALPRAZOLAM 0.25 MG PO TABS
0.2500 mg | ORAL_TABLET | Freq: Once | ORAL | Status: AC
  Administered 2017-09-15: 0.25 mg via ORAL
  Filled 2017-09-15: qty 1

## 2017-09-15 MED ORDER — ACETAMINOPHEN 500 MG PO TABS
1000.0000 mg | ORAL_TABLET | Freq: Four times a day (QID) | ORAL | Status: DC | PRN
  Administered 2017-09-15 – 2017-09-24 (×3): 1000 mg via ORAL
  Filled 2017-09-15 (×3): qty 2

## 2017-09-15 MED ORDER — FUROSEMIDE 10 MG/ML IJ SOLN
20.0000 mg | Freq: Once | INTRAMUSCULAR | Status: AC
  Administered 2017-09-15: 20 mg via INTRAVENOUS
  Filled 2017-09-15: qty 2

## 2017-09-15 MED ORDER — IOPAMIDOL (ISOVUE-300) INJECTION 61%: 15.0000 mL | Freq: Once | INTRAVENOUS | Status: AC | PRN

## 2017-09-15 MED ORDER — IOHEXOL 300 MG/ML  SOLN
100.0000 mL | Freq: Once | INTRAMUSCULAR | Status: AC | PRN
  Administered 2017-09-15: 80 mL via INTRAVENOUS

## 2017-09-15 MED ORDER — IOPAMIDOL (ISOVUE-300) INJECTION 61%
30.0000 mL | Freq: Once | INTRAVENOUS | Status: AC | PRN
  Administered 2017-09-15: 30 mL via ORAL

## 2017-09-15 MED ORDER — IOPAMIDOL (ISOVUE-300) INJECTION 61%
INTRAVENOUS | Status: AC
  Filled 2017-09-15: qty 30

## 2017-09-15 NOTE — Progress Notes (Signed)
Shirley [name] 10:35 AM  Subjective: Patient without any new complaints except a little worse shortness of breath and her hospital computer chart reviewed and her case discussed with my partner Dr. Michail Sermon as well as her son and daughter-in-law and according to the nurse her stools are dark green not really black and she has not had a previousGI workup and her endoscopy was reviewed  Objective: Vital signs stable afebrile no acute distress abdomen is soft nontender hemoglobin has decreased but BUN and creatinine has as well  Assessment: Subacute GI blood loss in a patient on a baby aspirin at home and some cauterized AVMs yesterday  Plan: Will proceed with a CT to rule out anything significant and if signs of continual bleeding despite holding aspirin consider capsule endoscopy or colonoscopy in the future and case discussed with the hospital team as well  [facility]  Pager [phone] After 5PM or if no answer call [phone]

## 2017-09-15 NOTE — Progress Notes (Signed)
PROGRESS NOTE [facility]   Shirley [name] [place]   KGY:[id number] DOB: February 23, 1922  DOA: 09/13/2017 PCP: Jani Gravel, MD   Brief Narrative:  Shirley [name] 84 year old female with medical history significant for hypertension, hypothyroidism and her murmur presented to the emergency department after being seen by PCP for generalized weakness.  She was referred to the ER after was found with hemoglobin of 5.5.  Patient very poor recent black tarry stool.  Never had endoscopy or colonoscopy.  Denies NSAIDs use.  Upon ED evaluation hemoglobin 5.5, hypotensive and tachycardic.  Patient was admitted with working diagnosis of GI bleed.  Gastroenterology was consulted and patient was started on Protonix drip,and was transfused 2 units of PRBCs.  Subjective: Patient seen and examined, report feeling well denies abdominal pain, nausea, vomiting and weakness.  Had a bowel movement which was dark green.  Hemoglobin drop 2 points overnight.  Denies chest pain and palpitations.  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 GI does not suspect that this is the source of her anemia.  Hemoglobin dropped today 6.6 will transfuse another 2 units of PRBCs.  Will obtain CT scan of the abdomen.  FOBT positive.  If CT scan negative will discuss with GI for possible capsule endoscopy.  Given her severe aortic stenosis will prefer not to have patient under anesthesia unless absolutely necessary. Continue to monitor CBC and transfuse if hemoglobin less than 7.    Severe aortic stenosis Cardiology was consulted, patient not candidate for TAVR at this point as she will need anticoagulation therapy and this will not be possible given GI bleed.  Patient asymptomatic, will continue to treat medically for now.  Urology recommendations appreciated  QTC prolongation QTC 549 on  admission decreased to 474 EKG shows right bundle branch block and possible repo Patient denies chest pain, and troponins are negative. Avoid QTC prolonging agents  Hypertension Blood pressure slightly above goal Her antihypertensive medications are on hold. Continue to monitor for now.  AKI on CKD stage III Prerenal from hypovolemia Creatinine improved after IV hydration and volume expansion. Check renal function in a.m.  Hypothyroidism  Continue current regimen  DVT prophylaxis: SCDs Code Status: DNR Family Communication: None at bedside Disposition Plan: TBD pending GI workup  Consultants:   Cardiology  GI  Procedures:   EGD 4/18  Antimicrobials:  None   Objective: Vitals:   09/14/17 1500 09/14/17 1529 09/14/17 2102 09/15/17 0605  BP: (!) 152/44 (!) 131/44 (!) 131/44 (!) 145/65  Pulse: 69 83 80 91  Resp: (!) 22  18 18   Temp:  98.6 F (37 C) 98.7 F (37.1 C) 97.8 F (36.6 C)  TempSrc:  Oral Oral Oral  SpO2: 98% 97% 100% 99%  Weight:      Height:        Intake/Output Summary (Last 24 hours) at 09/15/2017 1044 Last data filed at 09/15/2017 0700 Gross per 24 hour  Intake 2153.75 ml  Output 650 ml  Net 1503.75 ml   Filed Weights   09/13/17 1900 09/14/17 1048 09/14/17 1108  Weight: 31.1 kg (68 lb 9.6 oz) 70.2 kg (154 lb 12.2 oz) 69.9 kg (154 lb)    Examination:  General: Pt is alert, awake, not in acute distress Cardiovascular: RRR, S1/S2 +, no rubs, no gallops Respiratory: CTA bilaterally, no wheezing, no rhonchi Abdominal: Soft, NT, ND, bowel  sounds + Extremities: no edema, no cyanosis  Data Reviewed: I have personally reviewed following labs and imaging studies  CBC: Recent Labs  Lab 09/13/17 1609 09/14/17 0357 09/14/17 1724 09/15/17 0413  WBC 11.7* 9.8 10.1 8.0  HGB 5.5* 7.9* 8.1* 6.8*  HCT 17.3* 23.8* 25.1* 21.5*  MCV 101.8* 91.2 91.9 93.5  PLT 247 198 219 657   Basic Metabolic Panel: Recent Labs  Lab 09/13/17 1609  09/14/17 0357 09/14/17 1724 09/15/17 0413  NA 140 142 141 141  K 3.5 3.0* 3.3* 3.6  CL 105 108 109 112*  CO2 24 25 22 22   GLUCOSE 146* 110* 151* 97  BUN 61* 59* 51* 39*  CREATININE 1.37* 1.29* 1.30* 1.10*  CALCIUM 8.4* 8.2* 8.2* 8.0*  MG  --   --   --  1.9   GFR: Estimated Creatinine Clearance: 30 mL/min (A) (by C-G formula based on SCr of 1.1 mg/dL (H)). Liver Function Tests: Recent Labs  Lab 09/14/17 0357  AST 15  ALT 10*  ALKPHOS 47  BILITOT 0.6  PROT 4.4*  ALBUMIN 2.4*   No results for input(s): LIPASE, AMYLASE in the last 168 hours. No results for input(s): AMMONIA in the last 168 hours. Coagulation Profile: No results for input(s): INR, PROTIME in the last 168 hours. Cardiac Enzymes: No results for input(s): CKTOTAL, CKMB, CKMBINDEX, TROPONINI in the last 168 hours. BNP (last 3 results) No results for input(s): PROBNP in the last 8760 hours. HbA1C: No results for input(s): HGBA1C in the last 72 hours. CBG: No results for input(s): GLUCAP in the last 168 hours. Lipid Profile: No results for input(s): CHOL, HDL, LDLCALC, TRIG, CHOLHDL, LDLDIRECT in the last 72 hours. Thyroid Function Tests: No results for input(s): TSH, T4TOTAL, FREET4, T3FREE, THYROIDAB in the last 72 hours. Anemia Panel: No results for input(s): VITAMINB12, FOLATE, FERRITIN, TIBC, IRON, RETICCTPCT in the last 72 hours. Sepsis Labs: No results for input(s): PROCALCITON, LATICACIDVEN in the last 168 hours.  No results found for this or any previous visit (from the past 240 hour(s)).    Radiology Studies: No results found.    Scheduled Meds: . feeding supplement  1 Container Oral TID BM  . ferrous sulfate  325 mg Oral QPM  . levothyroxine  50 mcg Oral QAC breakfast  . pantoprazole  40 mg Oral Daily  . simvastatin  40 mg Oral Daily   Continuous Infusions: . sodium chloride    . sodium chloride 75 mL/hr at 09/15/17 0506  . sodium chloride       LOS: 2 days    Time spent: Total  of 35 minutes spent with pt, greater than 50% of which was spent in discussion of  treatment, counseling and coordination of care   Chipper Oman, MD Pager: Text Page via [email or web address]   If 7PM-7AM, please contact night-coverage [email or web address] 09/15/2017, 10:44 AM   Note - This record has been created using [employer]. Chart creation errors have been sought, but may not always have been located. Such creation errors do not reflect on the standard of medical care.

## 2017-09-15 NOTE — Progress Notes (Signed)
Patient complained of increased SOB. Pt appears to be flushed. VS stable, See flow sheet VS. Provider contacted, Order received to administer Furosemide 20 mg IV. Medication given, will continue to monitor.

## 2017-09-15 NOTE — Progress Notes (Signed)
CRITICAL VALUE ALERT  Critical Value: Hgb 6.8  Date & Time Notied:  09/15/17 05:02  Provider Notified: Dr Maudie Mercury  Orders Received/Actions taken: MD states he will place orders for PRBC

## 2017-09-16 LAB — TYPE AND SCREEN
ABO/RH(D): A POS
Antibody Screen: NEGATIVE
Unit division: 0
Unit division: 0
Unit division: 0
Unit division: 0

## 2017-09-16 LAB — CBC WITH DIFFERENTIAL/PLATELET
BASOS ABS: 0 10*3/uL (ref 0.0–0.1)
BASOS PCT: 0 %
Eosinophils Absolute: 0.2 10*3/uL (ref 0.0–0.7)
Eosinophils Relative: 2 %
HEMATOCRIT: 36.3 % (ref 36.0–46.0)
HEMOGLOBIN: 12.1 g/dL (ref 12.0–15.0)
Lymphocytes Relative: 5 %
Lymphs Abs: 0.5 10*3/uL — ABNORMAL LOW (ref 0.7–4.0)
MCH: 30 pg (ref 26.0–34.0)
MCHC: 33.3 g/dL (ref 30.0–36.0)
MCV: 89.9 fL (ref 78.0–100.0)
Monocytes Absolute: 0.7 10*3/uL (ref 0.1–1.0)
Monocytes Relative: 7 %
NEUTROS ABS: 8.9 10*3/uL — AB (ref 1.7–7.7)
NEUTROS PCT: 86 %
Platelets: 230 10*3/uL (ref 150–400)
RBC: 4.04 MIL/uL (ref 3.87–5.11)
RDW: 18.6 % — ABNORMAL HIGH (ref 11.5–15.5)
WBC: 10.4 10*3/uL (ref 4.0–10.5)

## 2017-09-16 LAB — HEMOGLOBIN AND HEMATOCRIT, BLOOD
HEMATOCRIT: 36.8 % (ref 36.0–46.0)
HEMATOCRIT: 36.3 % (ref 36.0–46.0)
HEMOGLOBIN: 12.1 g/dL (ref 12.0–15.0)
HEMOGLOBIN: 11.8 g/dL — AB (ref 12.0–15.0)

## 2017-09-16 LAB — BPAM RBC
Blood Product Expiration Date: 201905162359
Blood Product Expiration Date: 201905172359
Blood Product Expiration Date: 201905182359
Blood Product Expiration Date: 201905182359
ISSUE DATE / TIME: 201904172004
ISSUE DATE / TIME: 201904172251
ISSUE DATE / TIME: 201904191059
ISSUE DATE / TIME: 201904191740
Unit Type and Rh: 6200
Unit Type and Rh: 6200
Unit Type and Rh: 6200
Unit Type and Rh: 6200

## 2017-09-16 LAB — BASIC METABOLIC PANEL
ANION GAP: 11 (ref 5–15)
BUN: 25 mg/dL — ABNORMAL HIGH (ref 6–20)
CALCIUM: 8.7 mg/dL — AB (ref 8.9–10.3)
CO2: 21 mmol/L — ABNORMAL LOW (ref 22–32)
Chloride: 107 mmol/L (ref 101–111)
Creatinine, Ser: 1.14 mg/dL — ABNORMAL HIGH (ref 0.44–1.00)
GFR, EST AFRICAN AMERICAN: 46 mL/min — AB (ref >60)
GFR, EST NON AFRICAN AMERICAN: 40 mL/min — AB (ref >60)
Glucose, Bld: 123 mg/dL — ABNORMAL HIGH (ref 65–99)
Potassium: 4.3 mmol/L (ref 3.5–5.1)
SODIUM: 139 mmol/L (ref 135–145)

## 2017-09-16 LAB — MAGNESIUM: Magnesium: 1.9 mg/dL (ref 1.7–2.4)

## 2017-09-16 MED ORDER — FUROSEMIDE 20 MG PO TABS
20.0000 mg | ORAL_TABLET | Freq: Once | ORAL | Status: AC
  Administered 2017-09-16: 20 mg via ORAL
  Filled 2017-09-16: qty 1

## 2017-09-16 MED ORDER — CARVEDILOL 3.125 MG PO TABS
3.1250 mg | ORAL_TABLET | Freq: Two times a day (BID) | ORAL | Status: DC
  Administered 2017-09-16 – 2017-09-18 (×4): 3.125 mg via ORAL
  Filled 2017-09-16 (×4): qty 1

## 2017-09-16 MED ORDER — FUROSEMIDE 10 MG/ML IJ SOLN
20.0000 mg | Freq: Once | INTRAMUSCULAR | Status: AC
  Administered 2017-09-16: 20 mg via INTRAVENOUS
  Filled 2017-09-16: qty 2

## 2017-09-16 NOTE — ED Provider Notes (Signed)
[facility] Provider Note   CSN: [id number] Arrival date & time: 09/13/17  1454     History   Chief Complaint Chief Complaint  Patient presents with  . GI Bleeding    HPI Shirley [name] is a 84 y.o. female.  HPI   84 year old female with near syncopal event.  Patient reports that she was getting up to go to the bathroom when she felt very weak and lightheaded.  She did not lose consciousness.  Family is at bedside providing additional history.  They report several day history of generalized weakness.  Black appearing stools for the past several days.  Went to PCP he did blood work which showed a hemoglobin of around 5.  She is not anticoagulated.  Denies any chronic NSAID usage.  Denies any past history of GI bleeding.  Past Medical History:  Diagnosis Date  . Gout   . Heart murmur   . Hypercholesteremia   . Hypertension   . Thyroid disease    hypothyroid    Patient Active Problem List   Diagnosis Date Noted  . Aortic stenosis, severe   . GI bleed 09/13/2017    Past Surgical History:  Procedure Laterality Date  . APPENDECTOMY    . ESOPHAGOGASTRODUODENOSCOPY (EGD) WITH PROPOFOL N/A 09/14/2017   Procedure: ESOPHAGOGASTRODUODENOSCOPY (EGD) WITH PROPOFOL;  Surgeon: Wilford Corner, MD;  Location: [facility];  Service: Endoscopy;  Laterality: N/A;     OB History   None      Home Medications    Prior to Admission medications   Medication Sig Start Date End Date Taking? Authorizing Provider  acetaminophen (TYLENOL) 325 MG tablet Take 650 mg by mouth every 6 (six) hours as needed for moderate pain.   Yes [provider]  allopurinol (ZYLOPRIM) 300 MG tablet Take 300 mg by mouth daily.   Yes [provider]  carvedilol (COREG) 12.5 MG tablet Take 12.5 mg by mouth 2 (two) times daily with a meal.   Yes [provider]  cholecalciferol (VITAMIN D) 1000 units tablet Take 1,000 Units by  mouth daily.   Yes [provider]  docusate sodium (COLACE) 250 MG capsule Take 250 mg by mouth 2 (two) times daily.   Yes [provider]  ferrous sulfate 325 (65 FE) MG EC tablet Take 325 mg by mouth every evening.   Yes [provider]  furosemide (LASIX) 40 MG tablet Take 60 mg by mouth daily.   Yes [provider]  levothyroxine (SYNTHROID, LEVOTHROID) 50 MCG tablet Take 50 mcg by mouth daily.   Yes [provider]  lisinopril (PRINIVIL,ZESTRIL) 20 MG tablet Take 20 mg by mouth 2 (two) times daily.   Yes [provider]  meclizine (ANTIVERT) 25 MG tablet Take 25 mg by mouth 3 (three) times daily as needed for dizziness.   Yes [provider]  mupirocin ointment (BACTROBAN) 2 % Apply 1 application topically 3 (three) times daily.  08/25/17  Yes [provider]  omeprazole (PRILOSEC) 20 MG capsule Take 20 mg by mouth daily.   Yes [provider]  polyethylene glycol (MIRALAX / GLYCOLAX) packet Take 17 g by mouth daily as needed for moderate constipation.   Yes [provider]  simvastatin (ZOCOR) 40 MG tablet Take 40 mg by mouth daily.   Yes [provider]  traMADol (ULTRAM) 50 MG tablet Take 50 mg by mouth 2 (two) times daily.   Yes [provider]  cephALEXin (KEFLEX)  500 MG capsule Take 1 capsule (500 mg total) by mouth 3 (three) times daily. Patient not taking: Reported on 11/20/7626 31/5/17   Delora Fuel, MD  ondansetron (ZOFRAN) 4 MG tablet Take 1 tablet (4 mg total) by mouth every 8 (eight) hours as needed for nausea or vomiting. Patient not taking: Reported on 11/13/735 03/04/25   Delora Fuel, MD    Family History Family History  Problem Relation Age of Onset  . Cancer Mother   . Cancer Father   . Heart disease Brother     Social History Social History   Tobacco Use  . Smoking status: Former Research scientist (life sciences)  . Smokeless tobacco: Never Used  Substance Use Topics  . Alcohol  use: No    Frequency: Never  . Drug use: No     Allergies   Patient has no known allergies.   Review of Systems Review of Systems  All systems reviewed and negative, other than as noted in HPI.  Physical Exam Updated Vital Signs BP (!) 165/93 (BP Location: Left Arm)   Pulse (!) 117   Temp 97.8 F (36.6 C) (Oral)   Resp (!) 22   Ht 5\' 4"  (1.626 m)   Wt 69.9 kg (154 lb)   SpO2 95%   BMI 26.43 kg/m   Physical Exam  Constitutional: She appears well-developed and well-nourished. No distress.  HENT:  Head: Normocephalic and atraumatic.  Eyes: Conjunctivae are normal. Right eye exhibits no discharge. Left eye exhibits no discharge.  Neck: Neck supple.  Cardiovascular: Regular rhythm and normal heart sounds. Exam reveals no gallop and no friction rub.  No murmur heard. Mild tachcyardia  Pulmonary/Chest: Effort normal and breath sounds normal. No respiratory distress.  Abdominal: Soft. She exhibits no distension. There is no tenderness.  Musculoskeletal: She exhibits no edema or tenderness.  Neurological: She is alert.  Skin: Skin is warm and dry.  Psychiatric: She has a normal mood and affect. Her behavior is normal. Thought content normal.  Nursing note and vitals reviewed.    ED Treatments / Results  Labs (all labs ordered are listed, but only abnormal results are displayed) Labs Reviewed  BASIC METABOLIC PANEL - Abnormal; Notable for the following components:      Result Value   Glucose, Bld 146 (*)    BUN 61 (*)    Creatinine, Ser 1.37 (*)    Calcium 8.4 (*)    GFR calc non Af Amer 32 (*)    GFR calc Af Amer 37 (*)    All other components within normal limits  CBC - Abnormal; Notable for the following components:   WBC 11.7 (*)    RBC 1.70 (*)    Hemoglobin 5.5 (*)    HCT 17.3 (*)    MCV 101.8 (*)    RDW 20.9 (*)    All other components within normal limits  OCCULT BLOOD X 1 CARD TO LAB, STOOL - Abnormal; Notable for the following components:   Fecal  Occult Bld POSITIVE (*)    All other components within normal limits  CBC - Abnormal; Notable for the following components:   RBC 2.61 (*)    Hemoglobin 7.9 (*)    HCT 23.8 (*)    RDW 20.0 (*)    All other components within normal limits  COMPREHENSIVE METABOLIC PANEL - Abnormal; Notable for the following components:   Potassium 3.0 (*)    Glucose, Bld 110 (*)    BUN 59 (*)    Creatinine, Ser  1.29 (*)    Calcium 8.2 (*)    Total Protein 4.4 (*)    Albumin 2.4 (*)    ALT 10 (*)    GFR calc non Af Amer 34 (*)    GFR calc Af Amer 40 (*)    All other components within normal limits  BASIC METABOLIC PANEL - Abnormal; Notable for the following components:   Potassium 3.3 (*)    Glucose, Bld 151 (*)    BUN 51 (*)    Creatinine, Ser 1.30 (*)    Calcium 8.2 (*)    GFR calc non Af Amer 34 (*)    GFR calc Af Amer 39 (*)    All other components within normal limits  CBC - Abnormal; Notable for the following components:   RBC 2.73 (*)    Hemoglobin 8.1 (*)    HCT 25.1 (*)    RDW 21.1 (*)    All other components within normal limits  BASIC METABOLIC PANEL - Abnormal; Notable for the following components:   Chloride 112 (*)    BUN 39 (*)    Creatinine, Ser 1.10 (*)    Calcium 8.0 (*)    GFR calc non Af Amer 42 (*)    GFR calc Af Amer 48 (*)    All other components within normal limits  CBC - Abnormal; Notable for the following components:   RBC 2.30 (*)    Hemoglobin 6.8 (*)    HCT 21.5 (*)    RDW 21.0 (*)    All other components within normal limits  BASIC METABOLIC PANEL - Abnormal; Notable for the following components:   CO2 21 (*)    Glucose, Bld 123 (*)    BUN 25 (*)    Creatinine, Ser 1.14 (*)    Calcium 8.7 (*)    GFR calc non Af Amer 40 (*)    GFR calc Af Amer 46 (*)    All other components within normal limits  CBC WITH DIFFERENTIAL/PLATELET - Abnormal; Notable for the following components:   RDW 18.6 (*)    Neutro Abs 8.9 (*)    Lymphs Abs 0.5 (*)    All other  components within normal limits  HEMOGLOBIN AND HEMATOCRIT, BLOOD - Abnormal; Notable for the following components:   Hemoglobin 11.8 (*)    All other components within normal limits  MAGNESIUM  MAGNESIUM  HEMOGLOBIN AND HEMATOCRIT, BLOOD  TYPE AND SCREEN  PREPARE RBC (CROSSMATCH)  ABO/RH  PREPARE RBC (CROSSMATCH)    EKG EKG Interpretation  Date/Time:  Wednesday September 13 2017 15:34:57 EDT Ventricular Rate:  89 PR Interval:    QRS Duration: 152 QT Interval:  451 QTC Calculation: 549 R Axis:   10 Text Interpretation:  Unknown rhythm, irregular rate Right bundle branch block Repol abnrm suggests ischemia, diffuse leads Confirmed by Virgel Manifold [phone]) on 09/13/2017 4:29:47 PM   Radiology Ct Abdomen Pelvis W Contrast  Result Date: 09/15/2017 CLINICAL DATA:  84 year old female with a history of generalized weakness and a hemoglobin of 5.5 EXAM: CT ABDOMEN AND PELVIS WITH CONTRAST TECHNIQUE: Multidetector CT imaging of the abdomen and pelvis was performed using the standard protocol following bolus administration of intravenous contrast. CONTRAST:  54mL OMNIPAQUE IOHEXOL 300 MG/ML SOLN, 49mL ISOVUE-300 IOPAMIDOL (ISOVUE-300) INJECTION 61% COMPARISON:  CT 05/05/2017 FINDINGS: Lower chest: Bilateral pleural effusions. Mixed ground-glass nodular opacity at the bilateral lung bases, most likely atelectasis/scarring. Mitral annular calcifications. Hepatobiliary: Unremarkable appearance of the liver. Minimal hyperdense material at the  dependent aspect of the gallbladder, compatible with cholelithiasis. No inflammatory changes. Pancreas: Unremarkable pancreas Spleen: Unremarkable spleen Adrenals/Urinary Tract: Unremarkable bilateral adrenal glands. Right kidney without hydronephrosis. Calcification at the posterior cortex appears to represent vascular calcification. No definite evidence of nephrolithiasis. Unremarkable course of the right ureter. Left kidney unremarkable with no hydronephrosis or  nephrolithiasis. Unremarkable course of the left ureter. Unremarkable appearance of the urinary bladder. Stomach/Bowel: Hiatal hernia. Unremarkable stomach. Unremarkable small bowel. No abnormal distention. No focal wall thickening. No associated inflammatory changes. No transition point. Appendix is not visualized, however, no inflammatory changes are present adjacent to the cecum to indicate an appendicitis. Colon is decompressed with no focal wall thickening. No inflammatory changes. Minimal diverticular disease. No associated inflammatory changes. Vascular/Lymphatic: Calcifications of the abdominal aorta. Calcifications involve the mesenteric vessel origins and the bilateral renal artery origins, better characterized on recent CT angiogram. Mesenteric arteries and bilateral renal arteries appear patent. Bilateral iliac arteries and proximal femoral arteries are patent. Reproductive: Unremarkable uterus and adnexa. Other: No abdominal wall hernia. Minimal abdominal wall stranding/infiltration. Musculoskeletal: No acute displaced fracture. Advanced degenerative changes of the visualized thoracolumbar spine. Vacuum disc phenomenon present at T12-L1, L1-L2, L3-L4, L4-L5. Posterior disc bulge at L2-L3, L3-L4 with no bony canal narrowing. Advanced disc space narrowing at L1-L2 with sclerotic endplate changes. Degenerative changes of the bilateral hips. IMPRESSION: No acute CT finding. Bilateral pleural effusions with associated atelectasis. Aortic Atherosclerosis (ICD10-I70.0). Associated atherosclerotic changes of the mesenteric arteries and renal arteries. Degenerative disease of the lumbar spine. Mild diverticular disease without evidence of acute diverticulitis. Cholelithiasis without evidence of acute cholecystitis. Electronically Signed   By: Corrie Mckusick D.O.   On: 09/15/2017 17:09    Procedures Procedures (including critical care time)  CRITICAL CARE Performed by: Virgel Manifold Total critical care  time: 40 minutes Critical care time was exclusive of separately billable procedures and treating other patients. Critical care was necessary to treat or prevent imminent or life-threatening deterioration. Critical care was time spent personally by me on the following activities: development of treatment plan with patient and/or surrogate as well as nursing, discussions with consultants, evaluation of patient's response to treatment, examination of patient, obtaining history from patient or surrogate, ordering and performing treatments and interventions, ordering and review of laboratory studies, ordering and review of radiographic studies, pulse oximetry and re-evaluation of patient's condition.   Medications Ordered in ED Medications  ferrous sulfate tablet 325 mg (325 mg Oral Given 09/15/17 1725)  levothyroxine (SYNTHROID, LEVOTHROID) tablet 50 mcg (50 mcg Oral Given 09/16/17 0831)  meclizine (ANTIVERT) tablet 25 mg ( Oral MAR Unhold 09/14/17 1510)  simvastatin (ZOCOR) tablet 40 mg (40 mg Oral Given 09/15/17 0859)  feeding supplement (BOOST / RESOURCE BREEZE) liquid 1 Container (1 Container Oral Not Given 09/15/17 2038)  pantoprazole (PROTONIX) EC tablet 40 mg (40 mg Oral Given 09/15/17 1246)  iopamidol (ISOVUE-300) 61 % injection 15 mL (has no administration in time range)  iopamidol (ISOVUE-300) 61 % injection (30 mLs Oral Canceled Entry 09/15/17 1400)  acetaminophen (TYLENOL) tablet 1,000 mg (1,000 mg Oral Given 09/15/17 1534)  haloperidol lactate (HALDOL) injection 5 mg (5 mg Intravenous Given 09/16/17 0001)  furosemide (LASIX) tablet 20 mg (has no administration in time range)  carvedilol (COREG) tablet 3.125 mg (has no administration in time range)  0.9 %  sodium chloride infusion (10 mL/hr Intravenous New Bag/Given 09/15/17 1050)  pantoprazole (PROTONIX) 80 mg in sodium chloride 0.9 % 100 mL IVPB (0 mg Intravenous Stopped 09/13/17 1735)  potassium chloride 10 mEq in 100 mL IVPB (0 mEq Intravenous  Stopped 09/14/17 2221)  0.9 %  sodium chloride infusion ( Intravenous New Bag/Given 09/15/17 1050)  iohexol (OMNIPAQUE) 300 MG/ML solution 100 mL (80 mLs Intravenous Contrast Given 09/15/17 1618)  iopamidol (ISOVUE-300) 61 % injection 30 mL (30 mLs Oral Contrast Given 09/15/17 1400)  furosemide (LASIX) injection 20 mg (20 mg Intravenous Given 09/15/17 1908)  ALPRAZolam Duanne Moron) tablet 0.25 mg (0.25 mg Oral Given 09/15/17 2038)  furosemide (LASIX) injection 20 mg (20 mg Intravenous Given 09/16/17 0538)     Initial Impression / Assessment and Plan / ED Course  I have reviewed the triage vital signs and the nursing notes.  Pertinent labs & imaging results that were available during my care of the patient were reviewed by me and considered in my medical decision making (see chart for details).     84 year old female with symptomatic anemia.  Most likely secondary to upper GI bleed.  Hemoglobin in the thighs.  Will transfuse.  Protonix.  Admission.  Final Clinical Impressions(s) / ED Diagnoses   Final diagnoses:  Acute GI bleeding  Symptomatic anemia    ED Discharge Orders    None       Virgel Manifold, MD 09/16/17 1020

## 2017-09-16 NOTE — Progress Notes (Signed)
Shirley [name] 9:01 AM  Subjective: Patient without any signs of bleeding and no new complaints and wants to eat and minimal dark stool but on iron and case discussed with the nurse  Objective: Vital signs stable afebrile no acute distress abdomen is soft nontender hemoglobin increased posttransfusion BUN decreased cT okay  Assessment: GI blood loss probably from AVMs  Plan: Will slowly advance diet hold aspirin and blood thinners and nonsteroidals as an outpatient and observe for signs of further bleeding and consider capsule endoscopy or colonoscopy if signs of bleeding continue  [facility]  Pager [phone] After 5PM or if no answer call [phone]

## 2017-09-16 NOTE — Plan of Care (Signed)
Patient stable during 7 a to 7 p shift, very small amount of dark black-green stool with urine but no bright red or maroon color stools.  Patient is taking iron.  Patient continues to be confused, family at bedside for most of shift.

## 2017-09-16 NOTE — Progress Notes (Signed)
Patient did not have a good night last night. It started with her asking for sleep. On call notified and new orders given for one time dose of xanax. Few hours later, xanax had not worked and lab came in to get patient's blood. Patient was very upset, agitated, confused. Supervisor was called into room to talk to patient. Patient's son was called and he came up to stay in the room with her. On call was also notified and new orders were given of IV haldol. Dose was given. Once son came up patient was not trying to get out of the bed so much. Then this morning, son called out saying patient was complaining of shortness of breath. Oxygen sat was 100%.  Patient breathing hard. On call notified and new orders given for a one time dose of lasix. Will continue to monitor.

## 2017-09-16 NOTE — Progress Notes (Signed)
PROGRESS NOTE [facility]   Shirley [name] Shirley [name]   WUJ:[id number] DOB: [birth year]  DOA: 09/13/2017 PCP: Shirley Gravel, MD   Brief Narrative:  Shirley [name] 84 year old [name] with medical history significant for hypertension, hypothyroidism and her murmur presented to the emergency department after being seen by PCP for generalized weakness.  She was referred to the ER after was found with hemoglobin of 5.5.  Shirley [name] very poor recent black tarry stool.  Never had endoscopy or colonoscopy.  Denies NSAIDs use.  Upon ED evaluation hemoglobin 5.5, hypotensive and tachycardic.  Shirley [name] was admitted with working diagnosis of GI bleed.  Gastroenterology was consulted and Shirley [name] was started on Protonix drip,and was transfused 2 units of PRBCs.  Subjective: Shirley [name] seen and examined, she is sleepy today receive Haldol and Xanax overnight.  Otherwise no complaints.  Dark stool this morning, Shirley [name] on iron supplement.  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 GI does not suspect that this is the source of her anemia.  Status post 4 units of PRBCs, hemoglobin today 12.1 which is unusual after 2 units, up from 6.6.  Likely yesterday hemoglobin was a lab error.  No signs of overt bleeding.  Advance diet as tolerated.  If further bleeding will recommend capsule endoscopy.  GI recommendations appreciated  Severe aortic stenosis Cardiology was consulted, Shirley [name] not candidate for TAVR at this point as she will need anticoagulation therapy and this will not be possible given GI bleed.  Shirley [name] asymptomatic, will continue to treat medically for now.  At risk for CHF, she has received 2 doses of IV Lasix.  Continue to monitor  PAF Per cardiology adding beta-blocker given slight tachycardia and persistent PACs No anticoagulation at this time given GI  bleed  QTC prolongation QTC 549 on admission decreased to 474 EKG shows right bundle branch block and possible repo Shirley [name] denies chest pain, and troponins are negative. Avoid QTC prolonging agents Repeat EKG  Hypertension BP improved Coreg has been resume, still holding lisinopril Continue to monitor  AKI on CKD stage III Prerenal from hypovolemia Creatinine seems to be stable. Check BMP in a.m.  Hypothyroidism  Continue Synthroid  DVT prophylaxis: SCDs Code Status: DNR Family Communication: None at bedside Disposition Plan: If tolerating diet and hemoglobin remained stable Home in 1-2 days  Consultants:   Cardiology  GI  Procedures:   EGD 4/18  Antimicrobials:  None   Objective: Vitals:   09/15/17 2030 09/16/17 0353 09/16/17 1124 09/16/17 1124  BP: (!) 128/93 (!) 165/93  (!) 121/55  Pulse: (!) 116 (!) 117  82  Resp: 20 (!) 22    Temp: 98.3 F (36.8 C) 97.8 F (36.6 C)    TempSrc: Oral Oral    SpO2: 100% 95% 97% 97%  Weight:      Height:        Intake/Output Summary (Last 24 hours) at 09/16/2017 1424 Last data filed at 09/15/2017 2346 Gross per 24 hour  Intake 919 ml  Output -  Net 919 ml   Filed Weights   09/13/17 1900 09/14/17 1048 09/14/17 1108  Weight: 31.1 kg (68 lb 9.6 oz) 70.2 kg (154 lb 12.2 oz) 69.9 kg (154 lb)    Examination:  General: Sleepy, responsive to verbal stimuli Cardiovascular: N5-A2, + systolic murmur 4/6,  Respiratory: Good air entry, no wheezing or crackles Abdominal: Soft, NT,  ND, bowel sounds + Extremities: no edema  Data Reviewed: I have personally reviewed following labs and imaging studies  CBC: Recent Labs  Lab 09/13/17 1609 09/14/17 0357 09/14/17 1724 09/15/17 0413 09/16/17 0010 09/16/17 0414 09/16/17 0844  WBC 11.7* 9.8 10.1 8.0  --  10.4  --   NEUTROABS  --   --   --   --   --  8.9*  --   HGB 5.5* 7.9* 8.1* 6.8* 11.8* 12.1 12.1  HCT 17.3* 23.8* 25.1* 21.5* 36.3 36.3 36.8  MCV 101.8* 91.2 91.9  93.5  --  89.9  --   PLT 247 198 219 204  --  230  --    Basic Metabolic Panel: Recent Labs  Lab 09/13/17 1609 09/14/17 0357 09/14/17 1724 09/15/17 0413 09/16/17 0414  NA 140 142 141 141 139  K 3.5 3.0* 3.3* 3.6 4.3  CL 105 108 109 112* 107  CO2 24 25 22 22  21*  GLUCOSE 146* 110* 151* 97 123*  BUN 61* 59* 51* 39* 25*  CREATININE 1.37* 1.29* 1.30* 1.10* 1.14*  CALCIUM 8.4* 8.2* 8.2* 8.0* 8.7*  MG  --   --   --  1.9 1.9   GFR: Estimated Creatinine Clearance: 29 mL/min (A) (by C-G formula based on SCr of 1.14 mg/dL (H)). Liver Function Tests: Recent Labs  Lab 09/14/17 0357  AST 15  ALT 10*  ALKPHOS 47  BILITOT 0.6  PROT 4.4*  ALBUMIN 2.4*   No results for input(s): LIPASE, AMYLASE in the last 168 hours. No results for input(s): AMMONIA in the last 168 hours. Coagulation Profile: No results for input(s): INR, PROTIME in the last 168 hours. Cardiac Enzymes: No results for input(s): CKTOTAL, CKMB, CKMBINDEX, TROPONINI in the last 168 hours. BNP (last 3 results) No results for input(s): PROBNP in the last 8760 hours. HbA1C: No results for input(s): HGBA1C in the last 72 hours. CBG: No results for input(s): GLUCAP in the last 168 hours. Lipid Profile: No results for input(s): CHOL, HDL, LDLCALC, TRIG, CHOLHDL, LDLDIRECT in the last 72 hours. Thyroid Function Tests: No results for input(s): TSH, T4TOTAL, FREET4, T3FREE, THYROIDAB in the last 72 hours. Anemia Panel: No results for input(s): VITAMINB12, FOLATE, FERRITIN, TIBC, IRON, RETICCTPCT in the last 72 hours. Sepsis Labs: No results for input(s): PROCALCITON, LATICACIDVEN in the last 168 hours.  No results found for this or any previous visit (from the past 240 hour(s)).    Radiology Studies: Ct Abdomen Pelvis W Contrast  Result Date: 09/15/2017 CLINICAL DATA:  84 year old [name] with a history of generalized weakness and a hemoglobin of 5.5 EXAM: CT ABDOMEN AND PELVIS WITH CONTRAST TECHNIQUE: Multidetector CT  imaging of the abdomen and pelvis was performed using the standard protocol following bolus administration of intravenous contrast. CONTRAST:  93mL OMNIPAQUE IOHEXOL 300 MG/ML SOLN, 37mL ISOVUE-300 IOPAMIDOL (ISOVUE-300) INJECTION 61% COMPARISON:  CT 05/05/2017 FINDINGS: Lower chest: Bilateral pleural effusions. Mixed ground-glass nodular opacity at the bilateral lung bases, most likely atelectasis/scarring. Mitral annular calcifications. Hepatobiliary: Unremarkable appearance of the liver. Minimal hyperdense material at the dependent aspect of the gallbladder, compatible with cholelithiasis. No inflammatory changes. Pancreas: Unremarkable pancreas Spleen: Unremarkable spleen Adrenals/Urinary Tract: Unremarkable bilateral adrenal glands. Right kidney without hydronephrosis. Calcification at the posterior cortex appears to represent vascular calcification. No definite evidence of nephrolithiasis. Unremarkable course of the right ureter. Left kidney unremarkable with no hydronephrosis or nephrolithiasis. Unremarkable course of the left ureter. Unremarkable appearance of the urinary bladder. Stomach/Bowel: Hiatal hernia. Unremarkable stomach.  Unremarkable small bowel. No abnormal distention. No focal wall thickening. No associated inflammatory changes. No transition point. Appendix is not visualized, however, no inflammatory changes are present adjacent to the cecum to indicate an appendicitis. Colon is decompressed with no focal wall thickening. No inflammatory changes. Minimal diverticular disease. No associated inflammatory changes. Vascular/Lymphatic: Calcifications of the abdominal aorta. Calcifications involve the mesenteric vessel origins and the bilateral renal artery origins, better characterized on recent CT angiogram. Mesenteric arteries and bilateral renal arteries appear patent. Bilateral iliac arteries and proximal femoral arteries are patent. Reproductive: Unremarkable uterus and adnexa. Other: No  abdominal wall hernia. Minimal abdominal wall stranding/infiltration. Musculoskeletal: No acute displaced fracture. Advanced degenerative changes of the visualized thoracolumbar spine. Vacuum disc phenomenon present at T12-L1, L1-L2, L3-L4, L4-L5. Posterior disc bulge at L2-L3, L3-L4 with no bony canal narrowing. Advanced disc space narrowing at L1-L2 with sclerotic endplate changes. Degenerative changes of the bilateral hips. IMPRESSION: No acute CT finding. Bilateral pleural effusions with associated atelectasis. Aortic Atherosclerosis (ICD10-I70.0). Associated atherosclerotic changes of the mesenteric arteries and renal arteries. Degenerative disease of the lumbar spine. Mild diverticular disease without evidence of acute diverticulitis. Cholelithiasis without evidence of acute cholecystitis. Electronically Signed   By: Corrie Mckusick D.O.   On: 09/15/2017 17:09      Scheduled Meds: . carvedilol  3.125 mg Oral BID WC  . feeding supplement  1 Container Oral TID BM  . ferrous sulfate  325 mg Oral QPM  . levothyroxine  50 mcg Oral QAC breakfast  . pantoprazole  40 mg Oral Daily  . simvastatin  40 mg Oral Daily   Continuous Infusions:    LOS: 3 days    Time spent: Total of 25 minutes spent with pt, greater than 50% of which was spent in discussion of  treatment, counseling and coordination of care   Chipper Oman, MD Pager: Text Page via [email or web address]   If 7PM-7AM, please contact night-coverage [email or web address] 09/16/2017, 2:24 PM   Note - This record has been created using [employer]. Chart creation errors have been sought, but may not always have been located. Such creation errors do not reflect on the standard of medical care.

## 2017-09-16 NOTE — Progress Notes (Addendum)
Subjective:  Sleeping no issues over night   Objective:  Vitals:   09/15/17 1804 09/15/17 1849 09/15/17 2030 09/16/17 0353  BP: 98/67 124/85 (!) 128/93 (!) 165/93  Pulse: 97 (!) 112 (!) 116 (!) 117  Resp:   20 (!) 22  Temp: 99.3 F (37.4 C) 98.7 F (37.1 C) 98.3 F (36.8 C) 97.8 F (36.6 C)  TempSrc: Oral Oral Oral Oral  SpO2: 97% 100% 100% 95%  Weight:      Height:        Intake/Output from previous day:  Intake/Output Summary (Last 24 hours) at 09/16/2017 2202 Last data filed at 09/15/2017 2346 Gross per 24 hour  Intake 1039 ml  Output -  Net 1039 ml    Physical Exam: Affect appropriate Elderly white female  HEENT: normal Neck supple with no adenopathy JVP normal no bruits no thyromegaly Lungs clear with no wheezing and good diaphragmatic motion Heart:  S1/S2 diminished severe AS  murmur, no rub, gallop or click PMI normal Abdomen: benighn, BS positve, no tenderness, no AAA no bruit.  No HSM or HJR Distal pulses intact with no bruits No edema Neuro non-focal Skin warm and dry No muscular weakness   Lab Results: Basic Metabolic Panel: Recent Labs    09/15/17 0413 09/16/17 0414  NA 141 139  K 3.6 4.3  CL 112* 107  CO2 22 21*  GLUCOSE 97 123*  BUN 39* 25*  CREATININE 1.10* 1.14*  CALCIUM 8.0* 8.7*  MG 1.9 1.9   Liver Function Tests: Recent Labs    09/14/17 0357  AST 15  ALT 10*  ALKPHOS 47  BILITOT 0.6  PROT 4.4*  ALBUMIN 2.4*   No results for input(s): LIPASE, AMYLASE in the last 72 hours. CBC: Recent Labs    09/15/17 0413  09/16/17 0414 09/16/17 0844  WBC 8.0  --  10.4  --   NEUTROABS  --   --  8.9*  --   HGB 6.8*   < > 12.1 12.1  HCT 21.5*   < > 36.3 36.8  MCV 93.5  --  89.9  --   PLT 204  --  230  --    < > = values in this interval not displayed.    Imaging: Ct Abdomen Pelvis W Contrast  Result Date: 09/15/2017 CLINICAL DATA:  84 year old female with a history of generalized weakness and a hemoglobin of 5.5 EXAM: CT  ABDOMEN AND PELVIS WITH CONTRAST TECHNIQUE: Multidetector CT imaging of the abdomen and pelvis was performed using the standard protocol following bolus administration of intravenous contrast. CONTRAST:  31mL OMNIPAQUE IOHEXOL 300 MG/ML SOLN, 23mL ISOVUE-300 IOPAMIDOL (ISOVUE-300) INJECTION 61% COMPARISON:  CT 05/05/2017 FINDINGS: Lower chest: Bilateral pleural effusions. Mixed ground-glass nodular opacity at the bilateral lung bases, most likely atelectasis/scarring. Mitral annular calcifications. Hepatobiliary: Unremarkable appearance of the liver. Minimal hyperdense material at the dependent aspect of the gallbladder, compatible with cholelithiasis. No inflammatory changes. Pancreas: Unremarkable pancreas Spleen: Unremarkable spleen Adrenals/Urinary Tract: Unremarkable bilateral adrenal glands. Right kidney without hydronephrosis. Calcification at the posterior cortex appears to represent vascular calcification. No definite evidence of nephrolithiasis. Unremarkable course of the right ureter. Left kidney unremarkable with no hydronephrosis or nephrolithiasis. Unremarkable course of the left ureter. Unremarkable appearance of the urinary bladder. Stomach/Bowel: Hiatal hernia. Unremarkable stomach. Unremarkable small bowel. No abnormal distention. No focal wall thickening. No associated inflammatory changes. No transition point. Appendix is not visualized, however, no inflammatory changes are present adjacent to the cecum to indicate  an appendicitis. Colon is decompressed with no focal wall thickening. No inflammatory changes. Minimal diverticular disease. No associated inflammatory changes. Vascular/Lymphatic: Calcifications of the abdominal aorta. Calcifications involve the mesenteric vessel origins and the bilateral renal artery origins, better characterized on recent CT angiogram. Mesenteric arteries and bilateral renal arteries appear patent. Bilateral iliac arteries and proximal femoral arteries are patent.  Reproductive: Unremarkable uterus and adnexa. Other: No abdominal wall hernia. Minimal abdominal wall stranding/infiltration. Musculoskeletal: No acute displaced fracture. Advanced degenerative changes of the visualized thoracolumbar spine. Vacuum disc phenomenon present at T12-L1, L1-L2, L3-L4, L4-L5. Posterior disc bulge at L2-L3, L3-L4 with no bony canal narrowing. Advanced disc space narrowing at L1-L2 with sclerotic endplate changes. Degenerative changes of the bilateral hips. IMPRESSION: No acute CT finding. Bilateral pleural effusions with associated atelectasis. Aortic Atherosclerosis (ICD10-I70.0). Associated atherosclerotic changes of the mesenteric arteries and renal arteries. Degenerative disease of the lumbar spine. Mild diverticular disease without evidence of acute diverticulitis. Cholelithiasis without evidence of acute cholecystitis. Electronically Signed   By: Corrie Mckusick D.O.   On: 09/15/2017 17:09    Cardiac Studies:  ECG: Afib RBBB    Telemetry: Sinus PAC;s   Echo: EF 65-70% severe AS mean gradient 45 mmHg mild MS mild to moderate MR  Medications:   . feeding supplement  1 Container Oral TID BM  . ferrous sulfate  325 mg Oral QPM  . levothyroxine  50 mcg Oral QAC breakfast  . pantoprazole  40 mg Oral Daily  . simvastatin  40 mg Oral Daily      Assessment/Plan:   Severe AS: with mixed valve disease mild MS/mild moderate MR outpatient f/u with TAVR program I/O's positive At risk for CHF will give one dose lasix today   HTN:  Add low dose beta blocker given PAF and mitral stenosis   PAF: no anticoagulation due to PaF add beta blocker with relative tachycardia and persistent PaC;s  GI: bleed Hct better plan per IM/GI   Time spent reviewing chart all lab data echo and telemetry as well as exam and interview pateint 35 minutes   Jenkins Rouge 09/16/2017, 9:03 AM

## 2017-09-17 ENCOUNTER — Inpatient Hospital Stay (HOSPITAL_COMMUNITY): Payer: Medicare Other

## 2017-09-17 DIAGNOSIS — I5030 Unspecified diastolic (congestive) heart failure: Secondary | ICD-10-CM

## 2017-09-17 LAB — CBC
HCT: 32.2 % — ABNORMAL LOW (ref 36.0–46.0)
HEMOGLOBIN: 10.3 g/dL — AB (ref 12.0–15.0)
MCH: 29.4 pg (ref 26.0–34.0)
MCHC: 32 g/dL (ref 30.0–36.0)
MCV: 92 fL (ref 78.0–100.0)
Platelets: 205 10*3/uL (ref 150–400)
RBC: 3.5 MIL/uL — AB (ref 3.87–5.11)
RDW: 18.3 % — ABNORMAL HIGH (ref 11.5–15.5)
WBC: 6.7 10*3/uL (ref 4.0–10.5)

## 2017-09-17 LAB — BASIC METABOLIC PANEL
Anion gap: 9 (ref 5–15)
BUN: 24 mg/dL — AB (ref 6–20)
CHLORIDE: 107 mmol/L (ref 101–111)
CO2: 23 mmol/L (ref 22–32)
Calcium: 8.5 mg/dL — ABNORMAL LOW (ref 8.9–10.3)
Creatinine, Ser: 1.26 mg/dL — ABNORMAL HIGH (ref 0.44–1.00)
GFR calc Af Amer: 41 mL/min — ABNORMAL LOW (ref >60)
GFR calc non Af Amer: 35 mL/min — ABNORMAL LOW (ref >60)
GLUCOSE: 124 mg/dL — AB (ref 65–99)
POTASSIUM: 4 mmol/L (ref 3.5–5.1)
Sodium: 139 mmol/L (ref 135–145)

## 2017-09-17 LAB — BRAIN NATRIURETIC PEPTIDE: B Natriuretic Peptide: 1776.7 pg/mL — ABNORMAL HIGH (ref 0.0–100.0)

## 2017-09-17 LAB — PROCALCITONIN: Procalcitonin: <0.1 ng/mL

## 2017-09-17 MED ORDER — IPRATROPIUM-ALBUTEROL 0.5-2.5 (3) MG/3ML IN SOLN
3.0000 mL | Freq: Four times a day (QID) | RESPIRATORY_TRACT | Status: DC | PRN
  Administered 2017-09-17 – 2017-09-20 (×5): 3 mL via RESPIRATORY_TRACT
  Filled 2017-09-17 (×5): qty 3

## 2017-09-17 MED ORDER — FUROSEMIDE 10 MG/ML IJ SOLN
40.0000 mg | Freq: Two times a day (BID) | INTRAMUSCULAR | Status: DC
  Administered 2017-09-17 – 2017-09-18 (×3): 40 mg via INTRAVENOUS
  Filled 2017-09-17 (×3): qty 4

## 2017-09-17 MED ORDER — HYDROCORTISONE 1 % EX CREA
TOPICAL_CREAM | CUTANEOUS | Status: DC | PRN
  Filled 2017-09-17 (×2): qty 28

## 2017-09-17 MED ORDER — RISPERIDONE 0.5 MG PO TABS
0.5000 mg | ORAL_TABLET | Freq: Two times a day (BID) | ORAL | Status: DC
  Administered 2017-09-17 – 2017-09-26 (×19): 0.5 mg via ORAL
  Filled 2017-09-17 (×19): qty 1

## 2017-09-17 NOTE — Progress Notes (Signed)
Patient ambulatory in hallway with walker approximately 50-75 feet.  Tolerated well. Family at bedside.

## 2017-09-17 NOTE — Progress Notes (Signed)
Subjective:  She has no complaints Spoke with son this am   Objective:  Vitals:   09/16/17 1655 09/16/17 2008 09/17/17 0516 09/17/17 0825  BP: 105/71 (!) 143/72 (!) 89/61 93/63  Pulse: 87 96 (!) 113 60  Resp: 18 (!) 24 (!) 24   Temp: 98.5 F (36.9 C) 97.8 F (36.6 C) 98.9 F (37.2 C) 97.6 F (36.4 C)  TempSrc: Oral Oral Oral Oral  SpO2: 99% 100% 99% 100%  Weight:      Height:        Intake/Output from previous day: No intake or output data in the 24 hours ending 09/17/17 0834  Physical Exam: Affect appropriate Elderly white female  HEENT: normal Neck supple with no adenopathy JVP normal no bruits no thyromegaly Lungs bibasilar rales exp wheezing and good diaphragmatic motion Heart:  S1/S2 diminished severe AS  murmur, no rub, gallop or click PMI normal Abdomen: benighn, BS positve, no tenderness, no AAA no bruit.  No HSM or HJR Distal pulses intact with no bruits No edema Neuro non-focal Skin warm and dry No muscular weakness   Lab Results: Basic Metabolic Panel: Recent Labs    09/15/17 0413 09/16/17 0414 09/17/17 0437  NA 141 139 139  K 3.6 4.3 4.0  CL 112* 107 107  CO2 22 21* 23  GLUCOSE 97 123* 124*  BUN 39* 25* 24*  CREATININE 1.10* 1.14* 1.26*  CALCIUM 8.0* 8.7* 8.5*  MG 1.9 1.9  --    Liver Function Tests: No results for input(s): AST, ALT, ALKPHOS, BILITOT, PROT, ALBUMIN in the last 72 hours. No results for input(s): LIPASE, AMYLASE in the last 72 hours. CBC: Recent Labs    09/16/17 0414 09/16/17 0844 09/17/17 0437  WBC 10.4  --  6.7  NEUTROABS 8.9*  --   --   HGB 12.1 12.1 10.3*  HCT 36.3 36.8 32.2*  MCV 89.9  --  92.0  PLT 230  --  205    Imaging: Ct Abdomen Pelvis W Contrast  Result Date: 09/15/2017 CLINICAL DATA:  84 year old female with a history of generalized weakness and a hemoglobin of 5.5 EXAM: CT ABDOMEN AND PELVIS WITH CONTRAST TECHNIQUE: Multidetector CT imaging of the abdomen and pelvis was performed using the  standard protocol following bolus administration of intravenous contrast. CONTRAST:  58mL OMNIPAQUE IOHEXOL 300 MG/ML SOLN, 79mL ISOVUE-300 IOPAMIDOL (ISOVUE-300) INJECTION 61% COMPARISON:  CT 05/05/2017 FINDINGS: Lower chest: Bilateral pleural effusions. Mixed ground-glass nodular opacity at the bilateral lung bases, most likely atelectasis/scarring. Mitral annular calcifications. Hepatobiliary: Unremarkable appearance of the liver. Minimal hyperdense material at the dependent aspect of the gallbladder, compatible with cholelithiasis. No inflammatory changes. Pancreas: Unremarkable pancreas Spleen: Unremarkable spleen Adrenals/Urinary Tract: Unremarkable bilateral adrenal glands. Right kidney without hydronephrosis. Calcification at the posterior cortex appears to represent vascular calcification. No definite evidence of nephrolithiasis. Unremarkable course of the right ureter. Left kidney unremarkable with no hydronephrosis or nephrolithiasis. Unremarkable course of the left ureter. Unremarkable appearance of the urinary bladder. Stomach/Bowel: Hiatal hernia. Unremarkable stomach. Unremarkable small bowel. No abnormal distention. No focal wall thickening. No associated inflammatory changes. No transition point. Appendix is not visualized, however, no inflammatory changes are present adjacent to the cecum to indicate an appendicitis. Colon is decompressed with no focal wall thickening. No inflammatory changes. Minimal diverticular disease. No associated inflammatory changes. Vascular/Lymphatic: Calcifications of the abdominal aorta. Calcifications involve the mesenteric vessel origins and the bilateral renal artery origins, better characterized on recent CT angiogram. Mesenteric arteries and bilateral  renal arteries appear patent. Bilateral iliac arteries and proximal femoral arteries are patent. Reproductive: Unremarkable uterus and adnexa. Other: No abdominal wall hernia. Minimal abdominal wall  stranding/infiltration. Musculoskeletal: No acute displaced fracture. Advanced degenerative changes of the visualized thoracolumbar spine. Vacuum disc phenomenon present at T12-L1, L1-L2, L3-L4, L4-L5. Posterior disc bulge at L2-L3, L3-L4 with no bony canal narrowing. Advanced disc space narrowing at L1-L2 with sclerotic endplate changes. Degenerative changes of the bilateral hips. IMPRESSION: No acute CT finding. Bilateral pleural effusions with associated atelectasis. Aortic Atherosclerosis (ICD10-I70.0). Associated atherosclerotic changes of the mesenteric arteries and renal arteries. Degenerative disease of the lumbar spine. Mild diverticular disease without evidence of acute diverticulitis. Cholelithiasis without evidence of acute cholecystitis. Electronically Signed   By: Corrie Mckusick D.O.   On: 09/15/2017 17:09    Cardiac Studies:  ECG: Afib RBBB    Telemetry: Sinus PAC;s   Echo: EF 65-70% severe AS mean gradient 45 mmHg mild MS mild to moderate MR  Medications:   . carvedilol  3.125 mg Oral BID WC  . feeding supplement  1 Container Oral TID BM  . ferrous sulfate  325 mg Oral QPM  . levothyroxine  50 mcg Oral QAC breakfast  . pantoprazole  40 mg Oral Daily  . simvastatin  40 mg Oral Daily      Assessment/Plan:   Severe AS: with mixed valve disease mild MS/mild moderate MR outpatient f/u with TAVR program I/O's positive Lung exam worse today with rales and exp wheezing will give bid lasix today   HTN:  Add low dose beta blocker given PAF and mitral stenosis   PAF: no anticoagulation due to PaF add beta blocker with relative tachycardia and persistent PaC;s  GI: bleed Hct better plan per IM/GI     Jenkins Rouge 09/17/2017, 8:34 AM

## 2017-09-17 NOTE — Plan of Care (Signed)
VSS, patient sat up in chair for much of shift, walked short distance in hallway.  Patient continues to request oxygen in spite of oxygen saturation in the mid to high 90's on room air. Patient tolerating heart healthy diet, no evidence of bleeding, no BM this shift.  Family at bedside for much of shift.

## 2017-09-17 NOTE — Progress Notes (Signed)
Shirley [name] 9:21 AM  Subjective: Patient without signs of bleeding and no other GI complaints and case discussed with the nurse and her son  Objective: Vital signs stable afebrile no acute distress abdomen is soft nontender hemoglobin slight decrease BUN unchanged  Assessment: Probable AVM bleeding  Plan: Hold aspirin nonsteroidals on blood thinners at home follow CBC periodically we are happy to see back to consider capsule endoscopy or less likely colonoscopy if recurrent anemia and signs of GI blood loss continue otherwise hopefully with iron and holding aspirin she'll have less obvious bleeding and pleasecall me if I can be of any further assistance  [facility]  Pager [phone] After 5PM or if no answer call [phone]

## 2017-09-17 NOTE — Progress Notes (Signed)
Son came out into hallway stating mother was requesting oxygen and was "struggling to breathe".  RN went into room and patient noted to be lying comfortably in bed, NT had obtained oxygen saturation 96% on room air, patient not in respiratory distress but does appear mildly labored.  Lungs sounds with rhonchi and exp wheeze previously noted, cardiology has already ordered IV Lasix.   Did notify attending of the above, order placed for chest x-ray.  Will continue to monitor, did place patient on 2 liters for comfort.

## 2017-09-17 NOTE — Progress Notes (Addendum)
PROGRESS NOTE [facility]   Shirley [name] [place]   MVE:[id number] DOB: 07/21/1921  DOA: 09/13/2017 PCP: Jani Gravel, MD   Brief Narrative:  Shirley [name] 84 year old female with medical history significant for hypertension, hypothyroidism and her murmur presented to the emergency department after being seen by PCP for generalized weakness.  She was referred to the ER after was found with hemoglobin of 5.5.  Patient very poor recent black tarry stool.  Never had endoscopy or colonoscopy.  Denies NSAIDs use.  Upon ED evaluation hemoglobin 5.5, hypotensive and tachycardic.  Patient was admitted with working diagnosis of GI bleed.  Gastroenterology was consulted and patient was started on Protonix drip,and was transfused 2 units of PRBCs.  Subjective: Patient seen and examined, she continues to have sundowning.  Complaining of not able to catch a good deep breath.  Vital signs remained stable.  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 GI does not suspect that this is the source of her anemia.  Status post 4 units of PRBCs hemoglobin 10.3 No signs of active bleeding, patient receiving iron supplementation.  Continue to advance diet advance diet as tolerated.  GI signed off, if further signs of bleeding they will consider capsule endoscopy versus colonoscopy.  Recommending to continue to hold aspirin and NSAIDs.  Severe aortic stenosis Cardiology was consulted, patient not candidate for TAVR at this point as she will need anticoagulation therapy and this will not be possible given GI bleed.  Patient now with shortness of breath.  Unclear if this is related to solely AS versus in combination with TACO.  Patient is at risk for fluid overload.  Cardiology prescribed Lasix.  We will continue to monitor  TACO Lasix twice daily, ambulate.  Wean O2 as  tolerated. Checks x-ray with signs of pleural effusion and atelectasis, unlikely pneumonia, pro-calcitonin negative Added flutter valve.   PAF Beta-blocker was added, heart rate improved. No anticoagulation at this time given GI bleed  Hypertension BP stable  Continue Coreg, continue to hold lisinopril Continue to monitor  AKI on CKD stage III Prerenal from hypovolemia Creatinine seems to be plateauing, GFR ranging from 35-40 Meter BMP in a.m. since Lasix are increased  Hypothyroidism  Continue Synthroid  DVT prophylaxis: SCDs Code Status: DNR Family Communication: None at bedside Disposition Plan: If remains clinically stable and breathing status improved hopefully home in a.m.  Consultants:   Cardiology  GI  Procedures:   EGD 4/18  Antimicrobials:  None   Objective: Vitals:   09/17/17 0516 09/17/17 0825 09/17/17 0840 09/17/17 0900  BP: (!) 89/61 93/63 120/60   Pulse: (!) 113 60    Resp: (!) 24     Temp: 98.9 F (37.2 C) 97.6 F (36.4 C)    TempSrc: Oral Oral    SpO2: 99% 100%  96%  Weight:      Height:        Intake/Output Summary (Last 24 hours) at 09/17/2017 1325 Last data filed at 09/17/2017 1301 Gross per 24 hour  Intake -  Output 300 ml  Net -300 ml   Filed Weights   09/13/17 1900 09/14/17 1048 09/14/17 1108  Weight: 31.1 kg (68 lb 9.6 oz) 70.2 kg (154 lb 12.2 oz) 69.9 kg (154 lb)    Examination:  General: NAD Cardiovascular: RRR, G8-Z6, severe systolic murmur Respiratory: Breath sounds diminished, bibasilar rales  and crackles.  No wheezing Abdominal: Soft, NT, ND, bowel sounds + Extremities: Trace lower extremity edema bilaterally  Data Reviewed: I have personally reviewed following labs and imaging studies  CBC: Recent Labs  Lab 09/14/17 0357 09/14/17 1724 09/15/17 0413 09/16/17 0010 09/16/17 0414 09/16/17 0844 09/17/17 0437  WBC 9.8 10.1 8.0  --  10.4  --  6.7  NEUTROABS  --   --   --   --  8.9*  --   --   HGB 7.9* 8.1*  6.8* 11.8* 12.1 12.1 10.3*  HCT 23.8* 25.1* 21.5* 36.3 36.3 36.8 32.2*  MCV 91.2 91.9 93.5  --  89.9  --  92.0  PLT 198 219 204  --  230  --  528   Basic Metabolic Panel: Recent Labs  Lab 09/14/17 0357 09/14/17 1724 09/15/17 0413 09/16/17 0414 09/17/17 0437  NA 142 141 141 139 139  K 3.0* 3.3* 3.6 4.3 4.0  CL 108 109 112* 107 107  CO2 25 22 22  21* 23  GLUCOSE 110* 151* 97 123* 124*  BUN 59* 51* 39* 25* 24*  CREATININE 1.29* 1.30* 1.10* 1.14* 1.26*  CALCIUM 8.2* 8.2* 8.0* 8.7* 8.5*  MG  --   --  1.9 1.9  --    GFR: Estimated Creatinine Clearance: 26.2 mL/min (A) (by C-G formula based on SCr of 1.26 mg/dL (H)). Liver Function Tests: Recent Labs  Lab 09/14/17 0357  AST 15  ALT 10*  ALKPHOS 47  BILITOT 0.6  PROT 4.4*  ALBUMIN 2.4*   No results for input(s): LIPASE, AMYLASE in the last 168 hours. No results for input(s): AMMONIA in the last 168 hours. Coagulation Profile: No results for input(s): INR, PROTIME in the last 168 hours. Cardiac Enzymes: No results for input(s): CKTOTAL, CKMB, CKMBINDEX, TROPONINI in the last 168 hours. BNP (last 3 results) No results for input(s): PROBNP in the last 8760 hours. HbA1C: No results for input(s): HGBA1C in the last 72 hours. CBG: No results for input(s): GLUCAP in the last 168 hours. Lipid Profile: No results for input(s): CHOL, HDL, LDLCALC, TRIG, CHOLHDL, LDLDIRECT in the last 72 hours. Thyroid Function Tests: No results for input(s): TSH, T4TOTAL, FREET4, T3FREE, THYROIDAB in the last 72 hours. Anemia Panel: No results for input(s): VITAMINB12, FOLATE, FERRITIN, TIBC, IRON, RETICCTPCT in the last 72 hours. Sepsis Labs: Recent Labs  Lab 09/17/17 1149  PROCALCITON <0.10    No results found for this or any previous visit (from the past 240 hour(s)).    Radiology Studies: Ct Abdomen Pelvis W Contrast  Result Date: 09/15/2017 CLINICAL DATA:  84 year old female with a history of generalized weakness and a hemoglobin  of 5.5 EXAM: CT ABDOMEN AND PELVIS WITH CONTRAST TECHNIQUE: Multidetector CT imaging of the abdomen and pelvis was performed using the standard protocol following bolus administration of intravenous contrast. CONTRAST:  47mL OMNIPAQUE IOHEXOL 300 MG/ML SOLN, 16mL ISOVUE-300 IOPAMIDOL (ISOVUE-300) INJECTION 61% COMPARISON:  CT 05/05/2017 FINDINGS: Lower chest: Bilateral pleural effusions. Mixed ground-glass nodular opacity at the bilateral lung bases, most likely atelectasis/scarring. Mitral annular calcifications. Hepatobiliary: Unremarkable appearance of the liver. Minimal hyperdense material at the dependent aspect of the gallbladder, compatible with cholelithiasis. No inflammatory changes. Pancreas: Unremarkable pancreas Spleen: Unremarkable spleen Adrenals/Urinary Tract: Unremarkable bilateral adrenal glands. Right kidney without hydronephrosis. Calcification at the posterior cortex appears to represent vascular calcification. No definite evidence of nephrolithiasis. Unremarkable course of the right ureter. Left kidney unremarkable with no hydronephrosis or nephrolithiasis. Unremarkable course of the left ureter.  Unremarkable appearance of the urinary bladder. Stomach/Bowel: Hiatal hernia. Unremarkable stomach. Unremarkable small bowel. No abnormal distention. No focal wall thickening. No associated inflammatory changes. No transition point. Appendix is not visualized, however, no inflammatory changes are present adjacent to the cecum to indicate an appendicitis. Colon is decompressed with no focal wall thickening. No inflammatory changes. Minimal diverticular disease. No associated inflammatory changes. Vascular/Lymphatic: Calcifications of the abdominal aorta. Calcifications involve the mesenteric vessel origins and the bilateral renal artery origins, better characterized on recent CT angiogram. Mesenteric arteries and bilateral renal arteries appear patent. Bilateral iliac arteries and proximal femoral  arteries are patent. Reproductive: Unremarkable uterus and adnexa. Other: No abdominal wall hernia. Minimal abdominal wall stranding/infiltration. Musculoskeletal: No acute displaced fracture. Advanced degenerative changes of the visualized thoracolumbar spine. Vacuum disc phenomenon present at T12-L1, L1-L2, L3-L4, L4-L5. Posterior disc bulge at L2-L3, L3-L4 with no bony canal narrowing. Advanced disc space narrowing at L1-L2 with sclerotic endplate changes. Degenerative changes of the bilateral hips. IMPRESSION: No acute CT finding. Bilateral pleural effusions with associated atelectasis. Aortic Atherosclerosis (ICD10-I70.0). Associated atherosclerotic changes of the mesenteric arteries and renal arteries. Degenerative disease of the lumbar spine. Mild diverticular disease without evidence of acute diverticulitis. Cholelithiasis without evidence of acute cholecystitis. Electronically Signed   By: Corrie Mckusick D.O.   On: 09/15/2017 17:09   Dg Chest Port 1 View  Result Date: 09/17/2017 CLINICAL DATA:  Shortness of breath today. EXAM: PORTABLE CHEST 1 VIEW COMPARISON:  PA and lateral chest and CT chest 05/05/2017. FINDINGS: There is right worse than left basilar airspace disease with associated small to moderate effusions. Heart size is upper normal. Aortic atherosclerosis noted. The visualized skeletal structures are unremarkable. IMPRESSION: Right greater than left airspace disease which could be atelectasis or pneumonia with associated small to moderate effusions. Electronically Signed   By: Inge Rise M.D.   On: 09/17/2017 10:50    Scheduled Meds: . carvedilol  3.125 mg Oral BID WC  . feeding supplement  1 Container Oral TID BM  . ferrous sulfate  325 mg Oral QPM  . furosemide  40 mg Intravenous BID  . levothyroxine  50 mcg Oral QAC breakfast  . pantoprazole  40 mg Oral Daily  . risperiDONE  0.5 mg Oral BID  . simvastatin  40 mg Oral Daily   Continuous Infusions:    LOS: 4 days     Time spent: Total of 25 minutes spent with pt, greater than 50% of which was spent in discussion of  treatment, counseling and coordination of care  Chipper Oman, MD Pager: Text Page via [email or web address]   If 7PM-7AM, please contact night-coverage [email or web address] 09/17/2017, 1:25 PM   Note - This record has been created using [employer]. Chart creation errors have been sought, but may not always have been located. Such creation errors do not reflect on the standard of medical care.

## 2017-09-18 ENCOUNTER — Inpatient Hospital Stay (HOSPITAL_COMMUNITY): Payer: Medicare Other

## 2017-09-18 ENCOUNTER — Encounter (HOSPITAL_COMMUNITY): Payer: Self-pay | Admitting: Physician Assistant

## 2017-09-18 DIAGNOSIS — I4891 Unspecified atrial fibrillation: Secondary | ICD-10-CM

## 2017-09-18 DIAGNOSIS — I5031 Acute diastolic (congestive) heart failure: Secondary | ICD-10-CM | POA: Diagnosis present

## 2017-09-18 HISTORY — DX: Unspecified atrial fibrillation: I48.91

## 2017-09-18 LAB — BASIC METABOLIC PANEL
Anion gap: 12 (ref 5–15)
BUN: 30 mg/dL — ABNORMAL HIGH (ref 6–20)
CALCIUM: 8.3 mg/dL — AB (ref 8.9–10.3)
CO2: 23 mmol/L (ref 22–32)
CREATININE: 1.54 mg/dL — AB (ref 0.44–1.00)
Chloride: 104 mmol/L (ref 101–111)
GFR, EST AFRICAN AMERICAN: 32 mL/min — AB (ref >60)
GFR, EST NON AFRICAN AMERICAN: 28 mL/min — AB (ref >60)
Glucose, Bld: 132 mg/dL — ABNORMAL HIGH (ref 65–99)
Potassium: 3.6 mmol/L (ref 3.5–5.1)
SODIUM: 139 mmol/L (ref 135–145)

## 2017-09-18 LAB — CBC WITH DIFFERENTIAL/PLATELET
BASOS PCT: 0 %
Basophils Absolute: 0 10*3/uL (ref 0.0–0.1)
EOS ABS: 0.3 10*3/uL (ref 0.0–0.7)
EOS PCT: 4 %
HCT: 34.4 % — ABNORMAL LOW (ref 36.0–46.0)
HEMOGLOBIN: 10.8 g/dL — AB (ref 12.0–15.0)
Lymphocytes Relative: 9 %
Lymphs Abs: 0.5 10*3/uL — ABNORMAL LOW (ref 0.7–4.0)
MCH: 29.2 pg (ref 26.0–34.0)
MCHC: 31.4 g/dL (ref 30.0–36.0)
MCV: 93 fL (ref 78.0–100.0)
MONO ABS: 0.4 10*3/uL (ref 0.1–1.0)
Monocytes Relative: 8 %
NEUTROS PCT: 79 %
Neutro Abs: 4.5 10*3/uL (ref 1.7–7.7)
PLATELETS: 198 10*3/uL (ref 150–400)
RBC: 3.7 MIL/uL — ABNORMAL LOW (ref 3.87–5.11)
RDW: 17.7 % — AB (ref 11.5–15.5)
WBC: 5.8 10*3/uL (ref 4.0–10.5)

## 2017-09-18 MED ORDER — METOPROLOL TARTRATE 25 MG PO TABS
25.0000 mg | ORAL_TABLET | Freq: Two times a day (BID) | ORAL | Status: DC
  Administered 2017-09-18 – 2017-09-26 (×15): 25 mg via ORAL
  Filled 2017-09-18 (×15): qty 1

## 2017-09-18 MED ORDER — FUROSEMIDE 20 MG PO TABS
20.0000 mg | ORAL_TABLET | Freq: Two times a day (BID) | ORAL | Status: DC
  Administered 2017-09-19: 20 mg via ORAL
  Filled 2017-09-18: qty 1

## 2017-09-18 NOTE — Plan of Care (Signed)
  Problem: Clinical Measurements: Goal: Diagnostic test results will improve Outcome: Progressing   Problem: Activity: Goal: Risk for activity intolerance will decrease Outcome: Progressing   

## 2017-09-18 NOTE — Progress Notes (Signed)
Progress Note  Patient Name: Shirley [name] Date of Encounter: 09/18/2017  Primary Cardiologist:  Skeet Latch, MD  Subjective   Still SOB, no chest pain.   Inpatient Medications    Scheduled Meds: . carvedilol  3.125 mg Oral BID WC  . feeding supplement  1 Container Oral TID BM  . ferrous sulfate  325 mg Oral QPM  . furosemide  40 mg Intravenous BID  . levothyroxine  50 mcg Oral QAC breakfast  . pantoprazole  40 mg Oral Daily  . risperiDONE  0.5 mg Oral BID  . simvastatin  40 mg Oral Daily   Continuous Infusions:  PRN Meds: acetaminophen, haloperidol lactate, hydrocortisone cream, ipratropium-albuterol, meclizine   Vital Signs    Vitals:   09/17/17 1717 09/17/17 2019 09/17/17 2321 09/18/17 0543  BP: 110/60 99/67  116/69  Pulse:  70  (!) 111  Resp:  20  (!) 24  Temp:  98.5 F (36.9 C)  98.2 F (36.8 C)  TempSrc:  Oral    SpO2:  100% 97% 100%  Weight:      Height:        Intake/Output Summary (Last 24 hours) at 09/18/2017 0954 Last data filed at 09/18/2017 0800 Gross per 24 hour  Intake 120 ml  Output 400 ml  Net -280 ml   Filed Weights   09/13/17 1900 09/14/17 1048 09/14/17 1108  Weight: 68 lb 9.6 oz (31.1 kg) 154 lb 12.2 oz (70.2 kg) 154 lb (69.9 kg)    Telemetry    Atrial fib, generally RVR - Personally Reviewed  ECG    04/21 Atrial fib, RVR, HR 117, RBBB is old - Personally Reviewed  Physical Exam   General: Well developed, well nourished, female appearing in no acute distress. Head: Normocephalic, atraumatic.  Neck: Supple without bruits, JVD 12 cm. Lungs:  Resp regular and unlabored, rales bases, R>L. Heart: Irreg R&R, S1, S2, no S3, S4, 2-3/6 murmur; no rub. Abdomen: Soft, non-tender, non-distended with normoactive bowel sounds. No hepatomegaly. No rebound/guarding. No obvious abdominal masses. Extremities: No clubbing, cyanosis, no edema. Distal pedal pulses are 2+ bilaterally. Neuro: Alert and oriented X 3. Moves all  extremities spontaneously. Psych: Normal affect.  Labs    Hematology Recent Labs  Lab 09/16/17 0414 09/16/17 0844 09/17/17 0437 09/18/17 0813  WBC 10.4  --  6.7 5.8  RBC 4.04  --  3.50* 3.70*  HGB 12.1 12.1 10.3* 10.8*  HCT 36.3 36.8 32.2* 34.4*  MCV 89.9  --  92.0 93.0  MCH 30.0  --  29.4 29.2  MCHC 33.3  --  32.0 31.4  RDW 18.6*  --  18.3* 17.7*  PLT 230  --  205 198    Chemistry Recent Labs  Lab 09/14/17 0357  09/16/17 0414 09/17/17 0437 09/18/17 0813  NA 142   < > 139 139 139  K 3.0*   < > 4.3 4.0 3.6  CL 108   < > 107 107 104  CO2 25   < > 21* 23 23  GLUCOSE 110*   < > 123* 124* 132*  BUN 59*   < > 25* 24* 30*  CREATININE 1.29*   < > 1.14* 1.26* 1.54*  CALCIUM 8.2*   < > 8.7* 8.5* 8.3*  PROT 4.4*  --   --   --   --   ALBUMIN 2.4*  --   --   --   --   AST 15  --   --   --   --  ALT 10*  --   --   --   --   ALKPHOS 47  --   --   --   --   BILITOT 0.6  --   --   --   --   GFRNONAA 34*   < > 40* 35* 28*  GFRAA 40*   < > 46* 41* 32*  ANIONGAP 9   < > 11 9 12    < > = values in this interval not displayed.     BNP Recent Labs  Lab 09/17/17 1200  BNP 1,776.7*     Radiology    Ct Abdomen Pelvis W Contrast  Result Date: 09/15/2017 CLINICAL DATA:  84 year old female with a history of generalized weakness and a hemoglobin of 5.5 EXAM: CT ABDOMEN AND PELVIS WITH CONTRAST TECHNIQUE: Multidetector CT imaging of the abdomen and pelvis was performed using the standard protocol following bolus administration of intravenous contrast. CONTRAST:  93mL OMNIPAQUE IOHEXOL 300 MG/ML SOLN, 46mL ISOVUE-300 IOPAMIDOL (ISOVUE-300) INJECTION 61% COMPARISON:  CT 05/05/2017 FINDINGS: Lower chest: Bilateral pleural effusions. Mixed ground-glass nodular opacity at the bilateral lung bases, most likely atelectasis/scarring. Mitral annular calcifications. Hepatobiliary: Unremarkable appearance of the liver. Minimal hyperdense material at the dependent aspect of the gallbladder,  compatible with cholelithiasis. No inflammatory changes. Pancreas: Unremarkable pancreas Spleen: Unremarkable spleen Adrenals/Urinary Tract: Unremarkable bilateral adrenal glands. Right kidney without hydronephrosis. Calcification at the posterior cortex appears to represent vascular calcification. No definite evidence of nephrolithiasis. Unremarkable course of the right ureter. Left kidney unremarkable with no hydronephrosis or nephrolithiasis. Unremarkable course of the left ureter. Unremarkable appearance of the urinary bladder. Stomach/Bowel: Hiatal hernia. Unremarkable stomach. Unremarkable small bowel. No abnormal distention. No focal wall thickening. No associated inflammatory changes. No transition point. Appendix is not visualized, however, no inflammatory changes are present adjacent to the cecum to indicate an appendicitis. Colon is decompressed with no focal wall thickening. No inflammatory changes. Minimal diverticular disease. No associated inflammatory changes. Vascular/Lymphatic: Calcifications of the abdominal aorta. Calcifications involve the mesenteric vessel origins and the bilateral renal artery origins, better characterized on recent CT angiogram. Mesenteric arteries and bilateral renal arteries appear patent. Bilateral iliac arteries and proximal femoral arteries are patent. Reproductive: Unremarkable uterus and adnexa. Other: No abdominal wall hernia. Minimal abdominal wall stranding/infiltration. Musculoskeletal: No acute displaced fracture. Advanced degenerative changes of the visualized thoracolumbar spine. Vacuum disc phenomenon present at T12-L1, L1-L2, L3-L4, L4-L5. Posterior disc bulge at L2-L3, L3-L4 with no bony canal narrowing. Advanced disc space narrowing at L1-L2 with sclerotic endplate changes. Degenerative changes of the bilateral hips. IMPRESSION: No acute CT finding. Bilateral pleural effusions with associated atelectasis. Aortic Atherosclerosis (ICD10-I70.0). Associated  atherosclerotic changes of the mesenteric arteries and renal arteries. Degenerative disease of the lumbar spine. Mild diverticular disease without evidence of acute diverticulitis. Cholelithiasis without evidence of acute cholecystitis. Electronically Signed   By: Corrie Mckusick D.O.   On: 09/15/2017 17:09   Dg Chest Port 1 View  Result Date: 09/17/2017 CLINICAL DATA:  Shortness of breath today. EXAM: PORTABLE CHEST 1 VIEW COMPARISON:  PA and lateral chest and CT chest 05/05/2017. FINDINGS: There is right worse than left basilar airspace disease with associated small to moderate effusions. Heart size is upper normal. Aortic atherosclerosis noted. The visualized skeletal structures are unremarkable. IMPRESSION: Right greater than left airspace disease which could be atelectasis or pneumonia with associated small to moderate effusions. Electronically Signed   By: Inge Rise M.D.   On: 09/17/2017 10:50  Cardiac Studies   ECHO:  09/14/2017 - Left ventricle: The cavity size was normal. There was moderate   concentric hypertrophy. Systolic function was vigorous. The   estimated ejection fraction was in the range of 65% to 70%. Wall   motion was normal; there were no regional wall motion   abnormalities. There was fusion of early and atrial contributions   to ventricular filling. - Aortic valve: There was severe stenosis. Valve area (VTI): 1.04   cm^2. Valve area (Vmax): 0.83 cm^2. Valve area (Vmean): 0.8 cm^2.   Mean gradient (S): 45 mm Hg. Peak gradient (S): 70 mm Hg. - Mitral valve: Severely calcified annulus. The findings are   consistent with mild stenosis. There was mild to moderate   regurgitation directed centrally. Valve area by continuity   equation (using LVOT flow): 2.56 cm^2. - Left atrium: The atrium was mildly dilated. - Pericardium, extracardiac: A trivial pericardial effusion was   identified posterior to the heart.  Patient Profile     84 y.o. female w/ hx  HTN, Heart  murmur, hypothyroidism was admitted 04/17 w/ GIB s/p ablation of 2 lesions during EGD, Hgb 5.5, cards following for Afib RVR, CHF, AS  Assessment & Plan     Principal Problem: 1.  GI bleed - Per Dr Perley Jain note, likely from AVMs - now off NSAIDs  Active Problems: 2.  Aortic stenosis, severe - mixed valve disease mild MS/mild moderate MR outpatient f/u with TAVR program   3.  Acute diastolic CHF (congestive heart failure) (HCC) - on Lasix 40 mg bid, still w/ extra volume, continue - I/O are incomplete, also need daily wts  4.  Atrial fibrillation with rapid ventricular response (HCC) - HR is elevated but cannot increase Coreg 3.125 mg bid due to SBP90s-100s - Discuss w/ MD changing to metoprolol to see if that works better for her   Jonetta Speak , PA-C 9:54 AM 09/18/2017 Pager: [phone]

## 2017-09-18 NOTE — Progress Notes (Signed)
SATURATION QUALIFICATIONS: (This note is used to comply with regulatory documentation for home oxygen)  Patient Saturations on Room Air at Rest = 97%  Patient Saturations on Room Air while Ambulating = 85%  Patient Saturations on 2 Liters of oxygen while Ambulating = 93%  Please briefly explain why patient needs home oxygen: to maintain O2 sats > 88%

## 2017-09-18 NOTE — Progress Notes (Signed)
PROGRESS NOTE [facility]   Maurene Hollin [place]   WVP:[id number] DOB: 1921-10-21  DOA: 09/13/2017 PCP: Jani Gravel, MD   Brief Narrative:  Shirley [name] 84 year old female with medical history significant for hypertension, hypothyroidism and her murmur presented to the emergency department after being seen by PCP for generalized weakness.  She was referred to the ER after was found with hemoglobin of 5.5.  Patient very poor recent black tarry stool.  Never had endoscopy or colonoscopy.  Denies NSAIDs use.  Upon ED evaluation hemoglobin 5.5, hypotensive and tachycardic.  Patient was admitted with working diagnosis of GI bleed.  Gastroenterology was consulted and patient was started on Protonix drip,and was transfused 2 units of PRBCs.  Subjective: Patient seen and examined, she continues to have sundowning.  Complaining of not able to catch a good deep breath.  Vital signs remained stable.  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  Status post 4 units of PRBCs .No signs of active bleeding, patient receiving iron supplementation. GI signed off, if further signs of bleeding they will consider capsule endoscopy versus colonoscopy.  Recommending to continue to hold aspirin and NSAIDs.  Hemoglobin remains stable  Severe aortic stenosis Cardiology was consulted, patient not candidate for TAVR at this point as she will need anticoagulation therapy and this will not be possible given GI bleed.  However upon discussion with patient she is not interested on further invasive procedure.  We will continue to monitor closely.  I have offer palliative care service, family will think about it.  TACO (transfusion associated circulatory overload) Patient had good urine output but I&O's were positive.  No weight has been recorded Chest x-ray 4/21 with signs  of pleural effusion and atelectasis, unlikely pneumonia, pro-calcitonin negative.  Repeat chest x-ray from today with signs of pleural effusion and atelectasis.  Her creatinine slight increase, will discontinue IV Lasix and start oral Lasix 40 mg tomorrow.  TED hose  PAF Today in A. fib with RVR. Cardiology recommending to change Coreg to Lopressor for better heart rate control This may contributing to her dyspnea. Not candidate for anticoagulation given GI bleed  Hypertension BP stable  Continue Coreg, continue to hold lisinopril Continue to monitor  AKI on CKD stage III Prerenal from hypovolemia Slight bump in creatinine, likely due to IV Lasix Check renal function in a.m.  Hypothyroidism  Continue Synthroid  DVT prophylaxis: SCDs Code Status: DNR Family Communication: None at bedside Disposition Plan: If heart rate and breathing stable  hopefully can discharge in a.m.  Consultants:   Cardiology  GI  Procedures:   EGD 4/18  Antimicrobials:  None   Objective: Vitals:   09/17/17 2019 09/17/17 2321 09/18/17 0543 09/18/17 1326  BP: 99/67  116/69 123/75  Pulse: 70  (!) 111 84  Resp: 20  (!) 24 (!) 22  Temp: 98.5 F (36.9 C)  98.2 F (36.8 C) 98.5 F (36.9 C)  TempSrc: Oral   Oral  SpO2: 100% 97% 100% 100%  Weight:      Height:        Intake/Output Summary (Last 24 hours) at 09/18/2017 1626 Last data filed at 09/18/2017 0800 Gross per 24 hour  Intake 120 ml  Output 100 ml  Net 20 ml   Filed Weights   09/13/17 1900 09/14/17 1048 09/14/17 1108  Weight: 31.1 kg (68 lb  9.6 oz) 70.2 kg (154 lb 12.2 oz) 69.9 kg (154 lb)    Examination:  General: NAD, sitting up in chair Cardiovascular:  irregularly irregular, Z6-W1, severe systolic murmur Respiratory: Good air entry,  mild bilateral crackles Abdominal: Soft NT ND Extremities: Lower extremity edema 1+ bilaterally  Data Reviewed: I have personally reviewed following labs and imaging  studies  CBC: Recent Labs  Lab 09/14/17 1724 09/15/17 0413 09/16/17 0010 09/16/17 0414 09/16/17 0844 09/17/17 0437 09/18/17 0813  WBC 10.1 8.0  --  10.4  --  6.7 5.8  NEUTROABS  --   --   --  8.9*  --   --  4.5  HGB 8.1* 6.8* 11.8* 12.1 12.1 10.3* 10.8*  HCT 25.1* 21.5* 36.3 36.3 36.8 32.2* 34.4*  MCV 91.9 93.5  --  89.9  --  92.0 93.0  PLT 219 204  --  230  --  205 093   Basic Metabolic Panel: Recent Labs  Lab 09/14/17 1724 09/15/17 0413 09/16/17 0414 09/17/17 0437 09/18/17 0813  NA 141 141 139 139 139  K 3.3* 3.6 4.3 4.0 3.6  CL 109 112* 107 107 104  CO2 22 22 21* 23 23  GLUCOSE 151* 97 123* 124* 132*  BUN 51* 39* 25* 24* 30*  CREATININE 1.30* 1.10* 1.14* 1.26* 1.54*  CALCIUM 8.2* 8.0* 8.7* 8.5* 8.3*  MG  --  1.9 1.9  --   --    GFR: Estimated Creatinine Clearance: 21.4 mL/min (A) (by C-G formula based on SCr of 1.54 mg/dL (H)). Liver Function Tests: Recent Labs  Lab 09/14/17 0357  AST 15  ALT 10*  ALKPHOS 47  BILITOT 0.6  PROT 4.4*  ALBUMIN 2.4*   No results for input(s): LIPASE, AMYLASE in the last 168 hours. No results for input(s): AMMONIA in the last 168 hours. Coagulation Profile: No results for input(s): INR, PROTIME in the last 168 hours. Cardiac Enzymes: No results for input(s): CKTOTAL, CKMB, CKMBINDEX, TROPONINI in the last 168 hours. BNP (last 3 results) No results for input(s): PROBNP in the last 8760 hours. HbA1C: No results for input(s): HGBA1C in the last 72 hours. CBG: No results for input(s): GLUCAP in the last 168 hours. Lipid Profile: No results for input(s): CHOL, HDL, LDLCALC, TRIG, CHOLHDL, LDLDIRECT in the last 72 hours. Thyroid Function Tests: No results for input(s): TSH, T4TOTAL, FREET4, T3FREE, THYROIDAB in the last 72 hours. Anemia Panel: No results for input(s): VITAMINB12, FOLATE, FERRITIN, TIBC, IRON, RETICCTPCT in the last 72 hours. Sepsis Labs: Recent Labs  Lab 09/17/17 1149  PROCALCITON <0.10    No results  found for this or any previous visit (from the past 240 hour(s)).    Radiology Studies: Dg Chest Port 1 View  Result Date: 09/18/2017 CLINICAL DATA:  Shortness of breath. EXAM: PORTABLE CHEST 1 VIEW COMPARISON:  Radiograph of September 17, 2017. FINDINGS: Stable cardiomediastinal silhouette. Atherosclerosis of thoracic aorta is noted. No pneumothorax is noted. Mild bibasilar subsegmental atelectasis and pleural effusions are noted. Bony thorax is unremarkable. IMPRESSION: Mild bibasilar subsegmental atelectasis and pleural effusions. Aortic Atherosclerosis (ICD10-I70.0). Electronically Signed   By: Marijo Conception, M.D.   On: 09/18/2017 13:00   Dg Chest Port 1 View  Result Date: 09/17/2017 CLINICAL DATA:  Shortness of breath today. EXAM: PORTABLE CHEST 1 VIEW COMPARISON:  PA and lateral chest and CT chest 05/05/2017. FINDINGS: There is right worse than left basilar airspace disease with associated small to moderate effusions. Heart size is upper normal. Aortic atherosclerosis  noted. The visualized skeletal structures are unremarkable. IMPRESSION: Right greater than left airspace disease which could be atelectasis or pneumonia with associated small to moderate effusions. Electronically Signed   By: Inge Rise M.D.   On: 09/17/2017 10:50    Scheduled Meds: . carvedilol  3.125 mg Oral BID WC  . feeding supplement  1 Container Oral TID BM  . ferrous sulfate  325 mg Oral QPM  . levothyroxine  50 mcg Oral QAC breakfast  . pantoprazole  40 mg Oral Daily  . risperiDONE  0.5 mg Oral BID  . simvastatin  40 mg Oral Daily   Continuous Infusions:    LOS: 5 days    Time spent: Total of 25 minutes spent with pt, greater than 50% of which was spent in discussion of  treatment, counseling and coordination of care  Chipper Oman, MD Pager: Text Page via [email or web address]   If 7PM-7AM, please contact night-coverage [email or web address] 09/18/2017, 4:26 PM   Note - This record has been created using [employer]. Chart creation errors have been sought, but may not always have been located. Such creation errors do not reflect on the standard of medical care.

## 2017-09-19 LAB — URINALYSIS, ROUTINE W REFLEX MICROSCOPIC
BILIRUBIN URINE: NEGATIVE
Bilirubin Urine: NEGATIVE
GLUCOSE, UA: NEGATIVE mg/dL
Glucose, UA: NEGATIVE mg/dL
HGB URINE DIPSTICK: NEGATIVE
Hgb urine dipstick: NEGATIVE
KETONES UR: NEGATIVE mg/dL
Ketones, ur: NEGATIVE mg/dL
NITRITE: NEGATIVE
Nitrite: NEGATIVE
PH: 5 (ref 5.0–8.0)
PROTEIN: NEGATIVE mg/dL
Protein, ur: NEGATIVE mg/dL
SPECIFIC GRAVITY, URINE: 1.011 (ref 1.005–1.030)
Specific Gravity, Urine: 1.01 (ref 1.005–1.030)
Squamous Epithelial / LPF: NONE SEEN (ref 0–5)
pH: 5 (ref 5.0–8.0)

## 2017-09-19 LAB — BASIC METABOLIC PANEL
ANION GAP: 9 (ref 5–15)
BUN: 35 mg/dL — ABNORMAL HIGH (ref 6–20)
CO2: 25 mmol/L (ref 22–32)
Calcium: 8.5 mg/dL — ABNORMAL LOW (ref 8.9–10.3)
Chloride: 105 mmol/L (ref 101–111)
Creatinine, Ser: 1.44 mg/dL — ABNORMAL HIGH (ref 0.44–1.00)
GFR calc Af Amer: 35 mL/min — ABNORMAL LOW (ref >60)
GFR, EST NON AFRICAN AMERICAN: 30 mL/min — AB (ref >60)
Glucose, Bld: 129 mg/dL — ABNORMAL HIGH (ref 65–99)
POTASSIUM: 4.1 mmol/L (ref 3.5–5.1)
Sodium: 139 mmol/L (ref 135–145)

## 2017-09-19 MED ORDER — FUROSEMIDE 10 MG/ML IJ SOLN
20.0000 mg | Freq: Once | INTRAMUSCULAR | Status: AC
  Administered 2017-09-19: 20 mg via INTRAVENOUS
  Filled 2017-09-19: qty 2

## 2017-09-19 MED ORDER — TAMSULOSIN HCL 0.4 MG PO CAPS
0.4000 mg | ORAL_CAPSULE | Freq: Every day | ORAL | Status: DC
  Administered 2017-09-19 – 2017-09-26 (×8): 0.4 mg via ORAL
  Filled 2017-09-19 (×8): qty 1

## 2017-09-19 MED ORDER — FUROSEMIDE 10 MG/ML IJ SOLN
20.0000 mg | Freq: Two times a day (BID) | INTRAMUSCULAR | Status: DC
  Administered 2017-09-19 – 2017-09-22 (×6): 20 mg via INTRAVENOUS
  Filled 2017-09-19 (×6): qty 2

## 2017-09-19 NOTE — Progress Notes (Signed)
PROGRESS NOTE [facility]   Shirley [name]   RSW:[id number] DOB: [birth year]  DOA: 09/13/2017 PCP: Jani Gravel, MD   Brief Narrative:  Shirley [name] 84 year old female with medical history significant for hypertension, hypothyroidism and her murmur presented to the emergency department after being seen by PCP for generalized weakness.  She was referred to the ER after was found with hemoglobin of 5.5.  Patient very poor recent black tarry stool.  Never had endoscopy or colonoscopy.  Denies NSAIDs use.  Upon ED evaluation hemoglobin 5.5, hypotensive and tachycardic.  Patient was admitted with working diagnosis of GI bleed.  Gastroenterology was consulted and patient was started on Protonix drip and transfuse.  Subjective: Patient seen and examined, she this is troponin with bleeding this morning.  Feeling very sleepy and tired.  Patient had urinary retention overnight needing in and out cath. Denies chest pain, palpitations and weakness.   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  Status post 4 units of PRBCs, currently no signs of active bleeding.  Patient on iron supplementation.  GI has sign off and recommended consider capsule endoscopy versus colonoscopy if further bleeding.  Avoid NSAIDs, aspirin and steroids.  Hemoglobin remains stable  Severe aortic stenosis Cardiology was consulted, patient not candidate for TAVR at this point as she will need anticoagulation therapy and this will not be possible given GI bleed.  Patient not interested in basis procedure.  For now continue to manage medically and palliative care has been consulted.  Acute diastolic heart failure due to TACO (transfusion associated circulatory overload) No great urine output yesterday, she is +3.6 L.  Weight has increased from 154-160 since admission. She  continues to be fluid overloaded and now with urinary retention.  will place back on Lasix 20 mg IV twice daily.  given urinary retention will Place Foley catheter for accurate I&O's.  Her BNP was >.1700 Continue TED hose.  Cardiology's input appreciated, they have sign off  PAF Patient converted to sinus rhythm after changing carvedilol to metoprolol. Continue metoprolol 25 mg twice daily Not candidate for anti-correlation therapy given GI bleed  Hypertension BP stable  On metoprolol Monitor BP closely  AKI on CKD stage III Prerenal Creatinine stable at 1.44 Monitor renal function closely as she is back on IV Lasix  Hypothyroidism  Continue Synthroid  DVT prophylaxis: SCDs Code Status: DNR Family Communication: None at bedside Disposition Plan: Hopefully can discharge in 1-2 days if fluid overload improve  Consultants:   Cardiology  GI  Procedures:   EGD 4/18  Antimicrobials:  None   Objective: Vitals:   09/19/17 0415 09/19/17 0520 09/19/17 1001 09/19/17 1319  BP: 134/75   (!) 104/44  Pulse: 67   62  Resp: 18   18  Temp: 98.1 F (36.7 C)   98 F (36.7 C)  TempSrc: Oral   Oral  SpO2: 98%  99% 95%  Weight:  72.6 kg (160 lb 0.9 oz)    Height:        Intake/Output Summary (Last 24 hours) at 09/19/2017 1408 Last data filed at 09/19/2017 1300 Gross per 24 hour  Intake 420 ml  Output 475 ml  Net -55 ml   Filed Weights   09/14/17 1048 09/14/17 1108 09/19/17 0520  Weight: 70.2 kg (154 lb 12.2 oz) 69.9 kg (154 lb) 72.6 kg (160 lb  0.9 oz)    Examination:  General: NAD Cardiovascular: RRR, Z3-G9, severe systolic murmur Respiratory: Decreased breath sounds bilaterally, diffuse rhonchi, crackles and wheezing Abdominal: Soft, NT, ND Extremities: Lower extremity edema bilaterally 1+  Data Reviewed: I have personally reviewed following labs and imaging studies  CBC: Recent Labs  Lab 09/14/17 1724 09/15/17 0413 09/16/17 0010 09/16/17 0414  09/16/17 0844 09/17/17 0437 09/18/17 0813  WBC 10.1 8.0  --  10.4  --  6.7 5.8  NEUTROABS  --   --   --  8.9*  --   --  4.5  HGB 8.1* 6.8* 11.8* 12.1 12.1 10.3* 10.8*  HCT 25.1* 21.5* 36.3 36.3 36.8 32.2* 34.4*  MCV 91.9 93.5  --  89.9  --  92.0 93.0  PLT 219 204  --  230  --  205 924   Basic Metabolic Panel: Recent Labs  Lab 09/15/17 0413 09/16/17 0414 09/17/17 0437 09/18/17 0813 09/19/17 0504  NA 141 139 139 139 139  K 3.6 4.3 4.0 3.6 4.1  CL 112* 107 107 104 105  CO2 22 21* 23 23 25   GLUCOSE 97 123* 124* 132* 129*  BUN 39* 25* 24* 30* 35*  CREATININE 1.10* 1.14* 1.26* 1.54* 1.44*  CALCIUM 8.0* 8.7* 8.5* 8.3* 8.5*  MG 1.9 1.9  --   --   --    GFR: Estimated Creatinine Clearance: 23.3 mL/min (A) (by C-G formula based on SCr of 1.44 mg/dL (H)). Liver Function Tests: Recent Labs  Lab 09/14/17 0357  AST 15  ALT 10*  ALKPHOS 47  BILITOT 0.6  PROT 4.4*  ALBUMIN 2.4*   No results for input(s): LIPASE, AMYLASE in the last 168 hours. No results for input(s): AMMONIA in the last 168 hours. Coagulation Profile: No results for input(s): INR, PROTIME in the last 168 hours. Cardiac Enzymes: No results for input(s): CKTOTAL, CKMB, CKMBINDEX, TROPONINI in the last 168 hours. BNP (last 3 results) No results for input(s): PROBNP in the last 8760 hours. HbA1C: No results for input(s): HGBA1C in the last 72 hours. CBG: No results for input(s): GLUCAP in the last 168 hours. Lipid Profile: No results for input(s): CHOL, HDL, LDLCALC, TRIG, CHOLHDL, LDLDIRECT in the last 72 hours. Thyroid Function Tests: No results for input(s): TSH, T4TOTAL, FREET4, T3FREE, THYROIDAB in the last 72 hours. Anemia Panel: No results for input(s): VITAMINB12, FOLATE, FERRITIN, TIBC, IRON, RETICCTPCT in the last 72 hours. Sepsis Labs: Recent Labs  Lab 09/17/17 1149  PROCALCITON <0.10    No results found for this or any previous visit (from the past 240 hour(s)).    Radiology Studies: Dg  Chest Port 1 View  Result Date: 09/18/2017 CLINICAL DATA:  Shortness of breath. EXAM: PORTABLE CHEST 1 VIEW COMPARISON:  Radiograph of September 17, 2017. FINDINGS: Stable cardiomediastinal silhouette. Atherosclerosis of thoracic aorta is noted. No pneumothorax is noted. Mild bibasilar subsegmental atelectasis and pleural effusions are noted. Bony thorax is unremarkable. IMPRESSION: Mild bibasilar subsegmental atelectasis and pleural effusions. Aortic Atherosclerosis (ICD10-I70.0). Electronically Signed   By: Marijo Conception, M.D.   On: 09/18/2017 13:00    Scheduled Meds: . feeding supplement  1 Container Oral TID BM  . ferrous sulfate  325 mg Oral QPM  . furosemide  20 mg Oral BID  . levothyroxine  50 mcg Oral QAC breakfast  . metoprolol tartrate  25 mg Oral BID  . pantoprazole  40 mg Oral Daily  . risperiDONE  0.5 mg Oral BID  . simvastatin  40  mg Oral Daily  . tamsulosin  0.4 mg Oral QPC breakfast   Continuous Infusions:    LOS: 6 days    Time spent: Total of 25 minutes spent with pt, greater than 50% of which was spent in discussion of  treatment, counseling and coordination of care  Chipper Oman, MD Pager: Text Page via [email or web address]   If 7PM-7AM, please contact night-coverage [email or web address] 09/19/2017, 2:08 PM   Note - This record has been created using [employer]. Chart creation errors have been sought, but may not always have been located. Such creation errors do not reflect on the standard of medical care.

## 2017-09-19 NOTE — Progress Notes (Signed)
Patient with 50 cc's of urine output overnight. Bladder scan showed 489 cc's. NP on call notified. New ordered placed. In and out cath performed.  400 ml of foul smelling yellow urine returned.

## 2017-09-19 NOTE — Care Management Important Message (Signed)
Important Message  Patient Details  Name: Shirley [name] MRN: [id number] Date of Birth: May 16, 1922   Medicare Important Message Given:  Yes    Kerin Salen 09/19/2017, 1:16 PMImportant Message  Patient Details  Name: Shirley [name] MRN: [id number] Date of Birth: Oct 19, 1921   Medicare Important Message Given:  Yes    Kerin Salen 09/19/2017, 1:16 PM

## 2017-09-19 NOTE — Progress Notes (Signed)
Spoke with pt and son [name] concerning home health. [facility] was selected for [facility], will need [place] orders and face to face please.

## 2017-09-19 NOTE — Progress Notes (Signed)
Progress Note  Patient Name: Shirley [name] Date of Encounter: 09/19/2017  Primary Cardiologist:  Skeet Latch, MD  Subjective   Still SOB, wheezy cough   Inpatient Medications    Scheduled Meds: . feeding supplement  1 Container Oral TID BM  . ferrous sulfate  325 mg Oral QPM  . furosemide  20 mg Oral BID  . levothyroxine  50 mcg Oral QAC breakfast  . metoprolol tartrate  25 mg Oral BID  . pantoprazole  40 mg Oral Daily  . risperiDONE  0.5 mg Oral BID  . simvastatin  40 mg Oral Daily  . tamsulosin  0.4 mg Oral QPC breakfast   Continuous Infusions:  PRN Meds: acetaminophen, haloperidol lactate, hydrocortisone cream, ipratropium-albuterol, meclizine   Vital Signs    Vitals:   09/18/17 2047 09/19/17 0415 09/19/17 0520 09/19/17 1001  BP: 97/82 134/75    Pulse: 99 67    Resp: (!) 24 18    Temp: (!) 97.5 F (36.4 C) 98.1 F (36.7 C)    TempSrc: Oral Oral    SpO2: 98% 98%  99%  Weight:   160 lb 0.9 oz (72.6 kg)   Height:        Intake/Output Summary (Last 24 hours) at 09/19/2017 1143 Last data filed at 09/19/2017 0500 Gross per 24 hour  Intake 60 ml  Output 475 ml  Net -415 ml   Filed Weights   09/14/17 1048 09/14/17 1108 09/19/17 0520  Weight: 154 lb 12.2 oz (70.2 kg) 154 lb (69.9 kg) 160 lb 0.9 oz (72.6 kg)    Telemetry    Sinus rhythm w/ occ dropped beats - Personally Reviewed  ECG    04/21 Atrial fib, RVR, HR 117, RBBB is old - Personally Reviewed  Physical Exam   General: Well developed, well nourished, female appearing in no acute distress. Head: Normocephalic, atraumatic.  Neck: Supple without bruits, JVD 9 cm. Lungs:  Resp regular and unlabored, rales bases, R>L. Heart: RRR, S1, S2, no S3, S4, 2-3/6 murmur; no rub. Abdomen: Soft, non-tender, non-distended with normoactive bowel sounds. No hepatomegaly. No rebound/guarding. No obvious abdominal masses. Extremities: No clubbing, cyanosis, 1+ edema. Distal pedal pulses are 2+  bilaterally. Neuro: Alert and oriented X 3. Moves all extremities spontaneously. Psych: Normal affect.  Labs    Hematology Recent Labs  Lab 09/16/17 0414 09/16/17 0844 09/17/17 0437 09/18/17 0813  WBC 10.4  --  6.7 5.8  RBC 4.04  --  3.50* 3.70*  HGB 12.1 12.1 10.3* 10.8*  HCT 36.3 36.8 32.2* 34.4*  MCV 89.9  --  92.0 93.0  MCH 30.0  --  29.4 29.2  MCHC 33.3  --  32.0 31.4  RDW 18.6*  --  18.3* 17.7*  PLT 230  --  205 198    Chemistry Recent Labs  Lab 09/14/17 0357  09/17/17 0437 09/18/17 0813 09/19/17 0504  NA 142   < > 139 139 139  K 3.0*   < > 4.0 3.6 4.1  CL 108   < > 107 104 105  CO2 25   < > 23 23 25   GLUCOSE 110*   < > 124* 132* 129*  BUN 59*   < > 24* 30* 35*  CREATININE 1.29*   < > 1.26* 1.54* 1.44*  CALCIUM 8.2*   < > 8.5* 8.3* 8.5*  PROT 4.4*  --   --   --   --   ALBUMIN 2.4*  --   --   --   --  AST 15  --   --   --   --   ALT 10*  --   --   --   --   ALKPHOS 47  --   --   --   --   BILITOT 0.6  --   --   --   --   GFRNONAA 34*   < > 35* 28* 30*  GFRAA 40*   < > 41* 32* 35*  ANIONGAP 9   < > 9 12 9    < > = values in this interval not displayed.     BNP Recent Labs  Lab 09/17/17 1200  BNP 1,776.7*     Radiology    Ct Abdomen Pelvis W Contrast  Result Date: 09/15/2017 CLINICAL DATA:  84 year old female with a history of generalized weakness and a hemoglobin of 5.5 EXAM: CT ABDOMEN AND PELVIS WITH CONTRAST TECHNIQUE: Multidetector CT imaging of the abdomen and pelvis was performed using the standard protocol following bolus administration of intravenous contrast. CONTRAST:  25mL OMNIPAQUE IOHEXOL 300 MG/ML SOLN, 46mL ISOVUE-300 IOPAMIDOL (ISOVUE-300) INJECTION 61% COMPARISON:  CT 05/05/2017 FINDINGS: Lower chest: Bilateral pleural effusions. Mixed ground-glass nodular opacity at the bilateral lung bases, most likely atelectasis/scarring. Mitral annular calcifications. Hepatobiliary: Unremarkable appearance of the liver. Minimal hyperdense  material at the dependent aspect of the gallbladder, compatible with cholelithiasis. No inflammatory changes. Pancreas: Unremarkable pancreas Spleen: Unremarkable spleen Adrenals/Urinary Tract: Unremarkable bilateral adrenal glands. Right kidney without hydronephrosis. Calcification at the posterior cortex appears to represent vascular calcification. No definite evidence of nephrolithiasis. Unremarkable course of the right ureter. Left kidney unremarkable with no hydronephrosis or nephrolithiasis. Unremarkable course of the left ureter. Unremarkable appearance of the urinary bladder. Stomach/Bowel: Hiatal hernia. Unremarkable stomach. Unremarkable small bowel. No abnormal distention. No focal wall thickening. No associated inflammatory changes. No transition point. Appendix is not visualized, however, no inflammatory changes are present adjacent to the cecum to indicate an appendicitis. Colon is decompressed with no focal wall thickening. No inflammatory changes. Minimal diverticular disease. No associated inflammatory changes. Vascular/Lymphatic: Calcifications of the abdominal aorta. Calcifications involve the mesenteric vessel origins and the bilateral renal artery origins, better characterized on recent CT angiogram. Mesenteric arteries and bilateral renal arteries appear patent. Bilateral iliac arteries and proximal femoral arteries are patent. Reproductive: Unremarkable uterus and adnexa. Other: No abdominal wall hernia. Minimal abdominal wall stranding/infiltration. Musculoskeletal: No acute displaced fracture. Advanced degenerative changes of the visualized thoracolumbar spine. Vacuum disc phenomenon present at T12-L1, L1-L2, L3-L4, L4-L5. Posterior disc bulge at L2-L3, L3-L4 with no bony canal narrowing. Advanced disc space narrowing at L1-L2 with sclerotic endplate changes. Degenerative changes of the bilateral hips. IMPRESSION: No acute CT finding. Bilateral pleural effusions with associated atelectasis.  Aortic Atherosclerosis (ICD10-I70.0). Associated atherosclerotic changes of the mesenteric arteries and renal arteries. Degenerative disease of the lumbar spine. Mild diverticular disease without evidence of acute diverticulitis. Cholelithiasis without evidence of acute cholecystitis. Electronically Signed   By: Corrie Mckusick D.O.   On: 09/15/2017 17:09   Dg Chest Port 1 View  Result Date: 09/18/2017 CLINICAL DATA:  Shortness of breath. EXAM: PORTABLE CHEST 1 VIEW COMPARISON:  Radiograph of September 17, 2017. FINDINGS: Stable cardiomediastinal silhouette. Atherosclerosis of thoracic aorta is noted. No pneumothorax is noted. Mild bibasilar subsegmental atelectasis and pleural effusions are noted. Bony thorax is unremarkable. IMPRESSION: Mild bibasilar subsegmental atelectasis and pleural effusions. Aortic Atherosclerosis (ICD10-I70.0). Electronically Signed   By: Marijo Conception, M.D.   On: 09/18/2017 13:00   Dg  Chest Port 1 View  Result Date: 09/17/2017 CLINICAL DATA:  Shortness of breath today. EXAM: PORTABLE CHEST 1 VIEW COMPARISON:  PA and lateral chest and CT chest 05/05/2017. FINDINGS: There is right worse than left basilar airspace disease with associated small to moderate effusions. Heart size is upper normal. Aortic atherosclerosis noted. The visualized skeletal structures are unremarkable. IMPRESSION: Right greater than left airspace disease which could be atelectasis or pneumonia with associated small to moderate effusions. Electronically Signed   By: Inge Rise M.D.   On: 09/17/2017 10:50     Cardiac Studies   ECHO:  09/14/2017 - Left ventricle: The cavity size was normal. There was moderate   concentric hypertrophy. Systolic function was vigorous. The   estimated ejection fraction was in the range of 65% to 70%. Wall   motion was normal; there were no regional wall motion   abnormalities. There was fusion of early and atrial contributions   to ventricular filling. - Aortic valve:  There was severe stenosis. Valve area (VTI): 1.04   cm^2. Valve area (Vmax): 0.83 cm^2. Valve area (Vmean): 0.8 cm^2.   Mean gradient (S): 45 mm Hg. Peak gradient (S): 70 mm Hg. - Mitral valve: Severely calcified annulus. The findings are   consistent with mild stenosis. There was mild to moderate   regurgitation directed centrally. Valve area by continuity   equation (using LVOT flow): 2.56 cm^2. - Left atrium: The atrium was mildly dilated. - Pericardium, extracardiac: A trivial pericardial effusion was   identified posterior to the heart.  Patient Profile     84 y.o. female w/ hx  HTN, Heart murmur, hypothyroidism was admitted 04/17 w/ GIB s/p ablation of 2 lesions during EGD, Hgb 5.5, cards following for Afib RVR, CHF, AS  Assessment & Plan     Principal Problem: 1.  GI bleed - Per Dr Perley Jain note, likely from AVMs - now off NSAIDs  Active Problems: 2.  Aortic stenosis, severe - mixed valve disease mild MS/mild moderate MR - pt not interested in any procedures  3.  Acute diastolic CHF (congestive heart failure) (HCC) - on Lasix 40 mg bid, still w/ extra volume, continue - I/O are incomplete, wt today is up from admission, continue to follow   4.  Atrial fibrillation with rapid ventricular response (HCC) - HR was elevated on Coreg 3.125 mg bid  -  Tolerating metoprolol 25 mg bid, now in SR   Jonetta Speak , PA-C 11:43 AM 09/19/2017 Pager: [phone]

## 2017-09-20 DIAGNOSIS — D649 Anemia, unspecified: Secondary | ICD-10-CM

## 2017-09-20 DIAGNOSIS — Z7189 Other specified counseling: Secondary | ICD-10-CM

## 2017-09-20 DIAGNOSIS — Z515 Encounter for palliative care: Secondary | ICD-10-CM

## 2017-09-20 LAB — MAGNESIUM: MAGNESIUM: 1.8 mg/dL (ref 1.7–2.4)

## 2017-09-20 LAB — CBC WITH DIFFERENTIAL/PLATELET
BASOS ABS: 0 10*3/uL (ref 0.0–0.1)
Basophils Relative: 0 %
EOS PCT: 4 %
Eosinophils Absolute: 0.2 10*3/uL (ref 0.0–0.7)
HCT: 32.2 % — ABNORMAL LOW (ref 36.0–46.0)
Hemoglobin: 10.3 g/dL — ABNORMAL LOW (ref 12.0–15.0)
Lymphocytes Relative: 8 %
Lymphs Abs: 0.4 10*3/uL — ABNORMAL LOW (ref 0.7–4.0)
MCH: 29.4 pg (ref 26.0–34.0)
MCHC: 32 g/dL (ref 30.0–36.0)
MCV: 92 fL (ref 78.0–100.0)
MONO ABS: 0.5 10*3/uL (ref 0.1–1.0)
Monocytes Relative: 11 %
Neutro Abs: 3.9 10*3/uL (ref 1.7–7.7)
Neutrophils Relative %: 77 %
Platelets: 228 10*3/uL (ref 150–400)
RBC: 3.5 MIL/uL — AB (ref 3.87–5.11)
RDW: 16.7 % — AB (ref 11.5–15.5)
WBC: 5.1 10*3/uL (ref 4.0–10.5)

## 2017-09-20 LAB — BASIC METABOLIC PANEL
Anion gap: 11 (ref 5–15)
BUN: 34 mg/dL — AB (ref 6–20)
CHLORIDE: 103 mmol/L (ref 101–111)
CO2: 23 mmol/L (ref 22–32)
CREATININE: 1.41 mg/dL — AB (ref 0.44–1.00)
Calcium: 8 mg/dL — ABNORMAL LOW (ref 8.9–10.3)
GFR, EST AFRICAN AMERICAN: 36 mL/min — AB (ref >60)
GFR, EST NON AFRICAN AMERICAN: 31 mL/min — AB (ref >60)
Glucose, Bld: 112 mg/dL — ABNORMAL HIGH (ref 65–99)
Potassium: 3.8 mmol/L (ref 3.5–5.1)
SODIUM: 137 mmol/L (ref 135–145)

## 2017-09-20 NOTE — Progress Notes (Signed)
PROGRESS NOTE    Shirley [name]  ELF:[id number] DOB: [birth year] DOA: 09/13/2017 PCP: Jani Gravel, MD  Brief Narrative:  Shirley [name] 84 year old female with medical history significant for hypertension, hypothyroidism and her murmur presented to the emergency department after being seen by PCP for generalized weakness.  She was referred to the ER after was found with hemoglobin of 5.5.  Patient very poor recent black tarry stool.  Never had endoscopy or colonoscopy.  Denies NSAIDs use.  Upon ED evaluation hemoglobin 5.5, hypotensive and tachycardic.  Patient was admitted with working diagnosis of GI bleed.  Gastroenterology was consulted and patient was started on Protonix drip and transfuse.   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 . Transfuse to keep hemoglobin greater than 7.  Underwent a total of 4 units of prbc transfusion.  If she has further bleeding, plan for colonoscopy vs capsule endoscopy.    Severe AS: Cardiology consulted, not a candidate for TAVR. Palliative care consulted in view of the multiple medical problems.    Acute diastolic heart failure due to TACO (transfusion associated circulatory overload) Fluid overloaded with urinary retention. S/p foley catheter.  Strict intake and output.  Daily weights.  Cardiology on board.  Resume IV diuretics.  Urine output 1 lit yesterday.    PAF: In NSR resume metoprolol twice daily.  Not a candidate for ANTI CAOGULATION given GI bleed.    Hypertension: well controlled.    Hypothyroidism:  Resume synthroid.    Acute on stage 3 CKD:  - improved.  Stabilized at 1.4.    Anemia of chronic disease:  Hemoglobin stable around 10.      DVT  prophylaxis: scd's/  Code Status: DNR. Family Communication: NONE AT BEDSIDE.  Disposition Plan: pending evaluation    Consultants:   Palliative care  Cardiology.  Procedures: none.      Antimicrobials: none.    Subjective: Still having trouble breathing.  No chest pain. No nausea or vomiting.   Objective: Vitals:   09/19/17 2030 09/19/17 2045 09/20/17 0500 09/20/17 0614  BP: (!) 136/50   (!) 117/54  Pulse: 66   78  Resp: (!) 22   20  Temp: 99.3 F (37.4 C)   99.5 F (37.5 C)  TempSrc: Oral   Oral  SpO2: 99% 96%  97%  Weight:   76.6 kg (168 lb 14 oz)   Height:        Intake/Output Summary (Last 24 hours) at 09/20/2017 0930 Last data filed at 09/20/2017 0616 Gross per 24 hour  Intake 360 ml  Output 1050 ml  Net -690 ml   Filed Weights   09/14/17 1108 09/19/17 0520 09/20/17 0500  Weight: 69.9 kg (154 lb) 72.6 kg (160 lb 0.9 oz) 76.6 kg (168 lb 14 oz)    Examination:  General exam: Appears calm and comfortable  Respiratory system: crackles at bases with wheezing bilaterally.  Cardiovascular system: S1 & S2 heard, RRR. Murmer present.  Gastrointestinal system: Abdomen is nondistended, soft and nontender. No organomegaly or masses felt. Normal bowel sounds heard. Central nervous system: Alert and able to answer simple questions.  Extremities: mild pedal edema.  Skin: No rashes, lesions or ulcers Psychiatry:  Mood & affect  appropriate.     Data Reviewed: I have personally reviewed following labs and imaging studies  CBC: Recent Labs  Lab 09/15/17 0413  09/16/17 0414 09/16/17 0844 09/17/17 0437 09/18/17 0813 09/20/17 0412  WBC 8.0  --  10.4  --  6.7 5.8 5.1  NEUTROABS  --   --  8.9*  --   --  4.5 3.9  HGB 6.8*   < > 12.1 12.1 10.3* 10.8* 10.3*  HCT 21.5*   < > 36.3 36.8 32.2* 34.4* 32.2*  MCV 93.5  --  89.9  --  92.0 93.0 92.0  PLT 204  --  230  --  205 198 228   < > = values in this interval not displayed.   Basic Metabolic Panel: Recent  Labs  Lab 09/15/17 0413 09/16/17 0414 09/17/17 0437 09/18/17 0813 09/19/17 0504 09/20/17 0412  NA 141 139 139 139 139 137  K 3.6 4.3 4.0 3.6 4.1 3.8  CL 112* 107 107 104 105 103  CO2 22 21* 23 23 25 23   GLUCOSE 97 123* 124* 132* 129* 112*  BUN 39* 25* 24* 30* 35* 34*  CREATININE 1.10* 1.14* 1.26* 1.54* 1.44* 1.41*  CALCIUM 8.0* 8.7* 8.5* 8.3* 8.5* 8.0*  MG 1.9 1.9  --   --   --  1.8   GFR: Estimated Creatinine Clearance: 24.5 mL/min (A) (by C-G formula based on SCr of 1.41 mg/dL (H)). Liver Function Tests: Recent Labs  Lab 09/14/17 0357  AST 15  ALT 10*  ALKPHOS 47  BILITOT 0.6  PROT 4.4*  ALBUMIN 2.4*   No results for input(s): LIPASE, AMYLASE in the last 168 hours. No results for input(s): AMMONIA in the last 168 hours. Coagulation Profile: No results for input(s): INR, PROTIME in the last 168 hours. Cardiac Enzymes: No results for input(s): CKTOTAL, CKMB, CKMBINDEX, TROPONINI in the last 168 hours. BNP (last 3 results) No results for input(s): PROBNP in the last 8760 hours. HbA1C: No results for input(s): HGBA1C in the last 72 hours. CBG: No results for input(s): GLUCAP in the last 168 hours. Lipid Profile: No results for input(s): CHOL, HDL, LDLCALC, TRIG, CHOLHDL, LDLDIRECT in the last 72 hours. Thyroid Function Tests: No results for input(s): TSH, T4TOTAL, FREET4, T3FREE, THYROIDAB in the last 72 hours. Anemia Panel: No results for input(s): VITAMINB12, FOLATE, FERRITIN, TIBC, IRON, RETICCTPCT in the last 72 hours. Sepsis Labs: Recent Labs  Lab 09/17/17 1149  PROCALCITON <0.10    No results found for this or any previous visit (from the past 240 hour(s)).       Radiology Studies: Dg Chest Port 1 View  Result Date: 09/18/2017 CLINICAL DATA:  Shortness of breath. EXAM: PORTABLE CHEST 1 VIEW COMPARISON:  Radiograph of September 17, 2017. FINDINGS: Stable cardiomediastinal silhouette. Atherosclerosis of thoracic aorta is noted. No pneumothorax is noted.  Mild bibasilar subsegmental atelectasis and pleural effusions are noted. Bony thorax is unremarkable. IMPRESSION: Mild bibasilar subsegmental atelectasis and pleural effusions. Aortic Atherosclerosis (ICD10-I70.0). Electronically Signed   By: Marijo Conception, M.D.   On: 09/18/2017 13:00        Scheduled Meds: . feeding supplement  1 Container Oral TID BM  . ferrous sulfate  325 mg Oral QPM  . furosemide  20 mg Intravenous BID  . levothyroxine  50 mcg Oral QAC breakfast  . metoprolol tartrate  25 mg Oral BID  . pantoprazole  40 mg Oral Daily  . risperiDONE  0.5 mg Oral BID  . simvastatin  40 mg Oral Daily  . tamsulosin  0.4 mg Oral QPC breakfast   Continuous Infusions:   LOS: 7 days    Time spent: 30 minutes.     Hosie Poisson, MD [facility] Pager [phone]  If 7PM-7AM, please contact night-coverage [email or web address] Password [email or web address] 09/20/2017, 9:30 AM

## 2017-09-20 NOTE — Evaluation (Signed)
Physical Therapy Evaluation Patient Details Name: Shirley [name] MRN: [id number] DOB: [birth year] Today's Date: 09/20/2017   History of Present Illness  84yo female admitted secondary to 2 week history of black tarry stools, weakness, and anemia with HgB 5.5. 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 PMH gout, HTN, hypothyroidism   Clinical Impression   Patient received sitting up in chair, pleasant but lethargic, willing to attempt PT session today. Of note, patient with difficulty breathing at seated rest, signal of pulse ox poor and unable to obtain accurate signal; audible breath sounds and chest rattling/wheezing present. RAM testing appears WNL, strength testing per MMT grossly 4- to 3+/5. Attempted sit to stand, patient with minimal effort and unable to come to full stand with assist of +1, strongly recommend +2 during future PT sessions. Patient has considerable cough after taking drinks of water, RN aware and reports that patient is now on palliative care. She was left sitting up in the chair with all needs met this afternoon. She may benefit from trial of skilled PT services if agreeable and willing to participate given her current health status; otherwise recommend SNF moving forward.    Follow Up Recommendations SNF    Equipment Recommendations  Other (comment)(defer to next venue )    Recommendations for Other Services       Precautions / Restrictions Precautions Precautions: Fall Restrictions Weight Bearing Restrictions: No      Mobility  Bed Mobility               General bed mobility comments: DNT, received up in chair   Transfers Overall transfer level: Needs assistance Equipment used: Rolling walker (2 wheeled) Transfers: Sit to/from Stand Sit to Stand: Max assist         General transfer comment: attempted sit to stand with maxA of +1, unable to come to full stand   Ambulation/Gait             General Gait Details: unable    Stairs            Wheelchair Mobility    Modified Rankin (Stroke Patients Only)       Balance Overall balance assessment: Needs assistance Sitting-balance support: Feet supported Sitting balance-Leahy Scale: Fair         Standing balance comment: unable to test at eval                             Pertinent Vitals/Pain Pain Assessment: No/denies pain    Home Living Family/patient expects to be discharged to:: Private residence Living Arrangements: Children Available Help at Discharge: Family;Available PRN/intermittently Type of Home: House Home Access: Stairs to enter Entrance Stairs-Rails: Left Entrance Stairs-Number of Steps: 5 Home Layout: One level Home Equipment: Walker - 2 wheels;Cane - single point;Bedside commode      Prior Function Level of Independence: Independent with assistive device(s)               Hand Dominance        Extremity/Trunk Assessment   Upper Extremity Assessment Upper Extremity Assessment: Defer to OT evaluation    Lower Extremity Assessment Lower Extremity Assessment: Generalized weakness    Cervical / Trunk Assessment Cervical / Trunk Assessment: Kyphotic  Communication   Communication: No difficulties  Cognition Arousal/Alertness: Lethargic Behavior During Therapy: Flat affect Overall Cognitive Status: No family/caregiver present to determine baseline cognitive functioning  General Comments: patient with increased processing time to respond to verbal stimulation and cues, difficulty with sequencing       General Comments      Exercises     Assessment/Plan    PT Assessment Patient needs continued PT services  PT Problem List Decreased strength;Decreased mobility;Decreased coordination;Decreased balance       PT Treatment Interventions DME instruction;Therapeutic activities;Gait training;Therapeutic exercise;Patient/family education;Stair  training;Balance training;Functional mobility training;Neuromuscular re-education    PT Goals (Current goals can be found in the Care Plan section)  Acute Rehab PT Goals Patient Stated Goal: to feel better  PT Goal Formulation: With patient Time For Goal Achievement: 10/04/17 Potential to Achieve Goals: Fair    Frequency Min 2X/week   Barriers to discharge        Co-evaluation               AM-PAC PT "6 Clicks" Daily Activity  Outcome Measure Difficulty turning over in bed (including adjusting bedclothes, sheets and blankets)?: Unable Difficulty moving from lying on back to sitting on the side of the bed? : Unable Difficulty sitting down on and standing up from a chair with arms (e.g., wheelchair, bedside commode, etc,.)?: Unable Help needed moving to and from a bed to chair (including a wheelchair)?: Total Help needed walking in hospital room?: Total Help needed climbing 3-5 steps with a railing? : Total 6 Click Score: 6    End of Session Equipment Utilized During Treatment: Gait belt;Oxygen Activity Tolerance: Patient limited by fatigue Patient left: in chair;with call bell/phone within reach Nurse Communication: Mobility status PT Visit Diagnosis: Muscle weakness (generalized) (M62.81);Difficulty in walking, not elsewhere classified (R26.2);Unsteadiness on feet (R26.81)    Time: 5456-2563 PT Time Calculation (min) (ACUTE ONLY): 23 min   Charges:   PT Evaluation $PT Eval Moderate Complexity: 1 Mod PT Treatments $Therapeutic Activity: 8-22 mins   PT G Codes:        Deniece Ree PT, DPT, CBIS  Supplemental Physical Therapist [place]   Pager [phone]

## 2017-09-21 LAB — BASIC METABOLIC PANEL
Anion gap: 11 (ref 5–15)
BUN: 39 mg/dL — ABNORMAL HIGH (ref 6–20)
CALCIUM: 8.3 mg/dL — AB (ref 8.9–10.3)
CO2: 25 mmol/L (ref 22–32)
CREATININE: 1.49 mg/dL — AB (ref 0.44–1.00)
Chloride: 101 mmol/L (ref 101–111)
GFR calc non Af Amer: 29 mL/min — ABNORMAL LOW (ref >60)
GFR, EST AFRICAN AMERICAN: 33 mL/min — AB (ref >60)
Glucose, Bld: 109 mg/dL — ABNORMAL HIGH (ref 65–99)
Potassium: 3.7 mmol/L (ref 3.5–5.1)
Sodium: 137 mmol/L (ref 135–145)

## 2017-09-21 MED ORDER — IPRATROPIUM-ALBUTEROL 0.5-2.5 (3) MG/3ML IN SOLN
3.0000 mL | Freq: Four times a day (QID) | RESPIRATORY_TRACT | Status: DC
  Administered 2017-09-21 (×3): 3 mL via RESPIRATORY_TRACT
  Filled 2017-09-21 (×3): qty 3

## 2017-09-21 MED ORDER — SENNOSIDES-DOCUSATE SODIUM 8.6-50 MG PO TABS
2.0000 | ORAL_TABLET | Freq: Two times a day (BID) | ORAL | Status: DC
  Administered 2017-09-21 – 2017-09-22 (×3): 2 via ORAL
  Filled 2017-09-21 (×3): qty 2

## 2017-09-21 MED ORDER — ALBUTEROL SULFATE (2.5 MG/3ML) 0.083% IN NEBU
2.5000 mg | INHALATION_SOLUTION | RESPIRATORY_TRACT | Status: DC | PRN
  Administered 2017-09-22: 2.5 mg via RESPIRATORY_TRACT
  Filled 2017-09-21: qty 3

## 2017-09-21 MED ORDER — POLYETHYLENE GLYCOL 3350 17 G PO PACK: 17.0000 g | PACK | Freq: Every day | ORAL | Status: DC | PRN

## 2017-09-21 MED ORDER — IPRATROPIUM-ALBUTEROL 0.5-2.5 (3) MG/3ML IN SOLN
3.0000 mL | Freq: Three times a day (TID) | RESPIRATORY_TRACT | Status: DC
  Administered 2017-09-22: 3 mL via RESPIRATORY_TRACT
  Filled 2017-09-21: qty 3

## 2017-09-21 NOTE — Consult Note (Signed)
Consultation Note Date: 09/21/2017   Patient Name: Shirley [name]  DOB: Dec 27, 1921  MRN: [id number]  Age / Sex: 84 y.o., female  PCP: Jani Gravel, MD Referring Physician: Hosie Poisson, MD  Reason for Consultation: Establishing goals of care  HPI/Patient Profile: 84 y.o. female  with past medical history of HTN ,murmur, hypothyroidism admitted on 09/13/2017 with GI bleed with symptomatic anemia.  Workup has revealed severe aortic stenosis and hospital course complicated by HF secondary to transfusion associated circulatory overload.  Palliative consulted for goals of care.  Clinical Assessment and Goals of Care: I met today with Shirley [name] and her son at bedside.  She was sleepy and did not participate in conversation.    I spoke with his son.  I introduced palliative care as specialized medical care for people living with serious illness. It focuses on providing relief from the symptoms and stress of a serious illness. The goal is to improve quality of life for both the patient and the family.  Her son reports that he is frustrated and feels that she was "just fine really" until this hospitalization.  States that everything has "gone to s---" and he is upset that this has occurred so quickly and while she was hospitalized.  He reports frustration at feeling that nothing is helping her, but he is able to relay that she has multiple problems that are interacting, including bleed that prevents anticoagulating, circulatory overload, and sever AS.  I discussed with her son regarding his hopes moving forward, and he reports that his mother wants to be at home and he is planning on taking her home with home health.  We discussed that the hospital can be useful as long as she is getting well enough from care she receives at the hospital to enjoy time at home, but we may be at a point where, if her goal is to be  at home, she may be better served to plan on being at home and bringing care to him at home rather repeated trips to the hospital. We discussed hospice as a tool that may be beneficial in this goal as she has reach a point where we are trying to fix problems that are not fixable.  At this point, he is not interested in hospice services.  If she is unable to regain function and he continues to decline, I recommended reconsidering transition home with support of organization such as hospice.  NEXT OF KIN: son, [name]   SUMMARY OF RECOMMENDATIONS   - DNR/DNI - Son wants to transition home with home health.  We discussed potential for election of her hospice benefits, however, he wants to see how she progresses prior to this. - We agreed he will call if we can be of further assistance this hospitalization.    Code Status/Advance Care Planning:  DNR  Prognosis:   Unable to determine, but with her severe aortic stenosis (not currently candidate for interventional procedures), GI bleed, and continued decline, I believe there is high likelihood that she  will die in the next 6 months if her disease progresses along its expected course and I believe that she should qualify for hospice benefits if so desired.  Discharge Planning: Home with Home Health      Primary Diagnoses: Present on Admission: . GI bleed . Acute diastolic CHF (congestive heart failure) ([place]) . Atrial fibrillation with rapid ventricular response ([place])   I have reviewed the medical record, interviewed the patient and family, and examined the patient. The following aspects are pertinent.  Past Medical History:  Diagnosis Date  . Atrial fibrillation with rapid ventricular response ([place]) 09/18/2017  . Gout   . Heart murmur   . Hypercholesteremia   . Hypertension   . Thyroid disease    hypothyroid   Social History   Socioeconomic History  . Marital status: Widowed    Spouse name: Not on file  . Number of children: Not  on file  . Years of education: Not on file  . Highest education level: Not on file  Occupational History  . Not on file  Social Needs  . Financial resource strain: Not on file  . Food insecurity:    Worry: Not on file    Inability: Not on file  . Transportation needs:    Medical: Not on file    Non-medical: Not on file  Tobacco Use  . Smoking status: Former Research scientist (life sciences)  . Smokeless tobacco: Never Used  Substance and Sexual Activity  . Alcohol use: No    Frequency: Never  . Drug use: No  . Sexual activity: Not on file  Lifestyle  . Physical activity:    Days per week: Not on file    Minutes per session: Not on file  . Stress: Not on file  Relationships  . Social connections:    Talks on phone: Not on file    Gets together: Not on file    Attends religious service: Not on file    Active member of club or organization: Not on file    Attends meetings of clubs or organizations: Not on file    Relationship status: Not on file  Other Topics Concern  . Not on file  Social History Narrative  . Not on file   Family History  Problem Relation Age of Onset  . Cancer Mother   . Cancer Father   . Heart disease Brother    Scheduled Meds: . feeding supplement  1 Container Oral TID BM  . ferrous sulfate  325 mg Oral QPM  . furosemide  20 mg Intravenous BID  . levothyroxine  50 mcg Oral QAC breakfast  . metoprolol tartrate  25 mg Oral BID  . pantoprazole  40 mg Oral Daily  . risperiDONE  0.5 mg Oral BID  . simvastatin  40 mg Oral Daily  . tamsulosin  0.4 mg Oral QPC breakfast   Continuous Infusions: PRN Meds:.acetaminophen, haloperidol lactate, hydrocortisone cream, ipratropium-albuterol, meclizine Medications Prior to Admission:  Prior to Admission medications   Medication Sig Start Date End Date Taking? Authorizing Provider  acetaminophen (TYLENOL) 325 MG tablet Take 650 mg by mouth every 6 (six) hours as needed for moderate pain.   Yes [provider]  allopurinol  (ZYLOPRIM) 300 MG tablet Take 300 mg by mouth daily.   Yes [provider]  carvedilol (COREG) 12.5 MG tablet Take 12.5 mg by mouth 2 (two) times daily with a meal.   Yes [provider]  cholecalciferol (VITAMIN D) 1000 units tablet Take 1,000  Units by mouth daily.   Yes [provider]  docusate sodium (COLACE) 250 MG capsule Take 250 mg by mouth 2 (two) times daily.   Yes [provider]  ferrous sulfate 325 (65 FE) MG EC tablet Take 325 mg by mouth every evening.   Yes [provider]  furosemide (LASIX) 40 MG tablet Take 60 mg by mouth daily.   Yes [provider]  levothyroxine (SYNTHROID, LEVOTHROID) 50 MCG tablet Take 50 mcg by mouth daily.   Yes [provider]  lisinopril (PRINIVIL,ZESTRIL) 20 MG tablet Take 20 mg by mouth 2 (two) times daily.   Yes [provider]  meclizine (ANTIVERT) 25 MG tablet Take 25 mg by mouth 3 (three) times daily as needed for dizziness.   Yes [provider]  mupirocin ointment (BACTROBAN) 2 % Apply 1 application topically 3 (three) times daily.  08/25/17  Yes [provider]  omeprazole (PRILOSEC) 20 MG capsule Take 20 mg by mouth daily.   Yes [provider]  polyethylene glycol (MIRALAX / GLYCOLAX) packet Take 17 g by mouth daily as needed for moderate constipation.   Yes [provider]  simvastatin (ZOCOR) 40 MG tablet Take 40 mg by mouth daily.   Yes [provider]  traMADol (ULTRAM) 50 MG tablet Take 50 mg by mouth 2 (two) times daily.   Yes [provider]  cephALEXin (KEFLEX) 500 MG capsule Take 1 capsule (500 mg total) by mouth 3 (three) times daily. Patient not taking: Reported on 5/36/1443 15/4/00   Delora Fuel, MD  ondansetron (ZOFRAN) 4 MG tablet Take 1 tablet (4 mg total) by mouth every 8 (eight) hours as needed for nausea or vomiting. Patient not taking: Reported on 8/67/6195 01/30/25   Delora Fuel, MD   No Known  Allergies Review of Systems  Unable to assess.   Physical Exam General: Sitting in bedside chair.  Does not really participate in conversation. Heart: Regular rate and rhythm. Murmur appreciated. Lungs: Diminised, crackles in bases Abdomen: Soft, nontender, nondistended, positive bowel sounds.  Ext: No significant edema Skin: Warm and dry  Vital Signs: BP (!) 148/69   Pulse 69   Temp 97.8 F (36.6 C) (Oral)   Resp 18   Ht '5\' 4"'  (1.626 m)   Wt 71.3 kg (157 lb 3 oz)   SpO2 96%   BMI 26.98 kg/m  Pain Scale: 0-10 POSS *See Group Information*: 1-Acceptable,Awake and alert Pain Score: Asleep   SpO2: SpO2: 96 % O2 Device:SpO2: 96 % O2 Flow Rate: .O2 Flow Rate (L/min): 2 L/min  IO: Intake/output summary:   Intake/Output Summary (Last 24 hours) at 09/21/2017 0843 Last data filed at 09/21/2017 0736 Gross per 24 hour  Intake 680 ml  Output 1325 ml  Net -645 ml    LBM: Last BM Date: 09/17/17 Baseline Weight: Weight: 31.1 kg (68 lb 9.6 oz) Most recent weight: Weight: 71.3 kg (157 lb 3 oz)     Palliative Assessment/Data:   Flowsheet Rows     Most Recent Value  Intake Tab  Referral Department  Hospitalist  Unit at Time of Referral  Med/Surg Unit  Palliative Care Primary Diagnosis  Cardiac  Date Notified  09/19/17  Palliative Care Type  New Palliative care  Reason for referral  Clarify Goals of Care  Date of Admission  09/13/17  Date first seen by Palliative Care  09/20/17  # of days Palliative referral response time  1 Day(s)  # of days IP prior  to Palliative referral  6  Clinical Assessment  Palliative Performance Scale Score  30%  Pain Max last 24 hours  Not able to report  Pain Min Last 24 hours  Not able to report  Psychosocial & Spiritual Assessment  Palliative Care Outcomes  Patient/Family meeting held?  Yes  Who was at the meeting?  Son/HCPOA  Palliative Care Outcomes  Counseled regarding hospice, Clarified goals of care      Time Total: 60 Greater than  50%  of this time was spent counseling and coordinating care related to the above assessment and plan.  Signed by: Micheline Rough, MD   Please contact Palliative Medicine Team phone at [phone] for questions and concerns.  For individual provider: See Shea Evans

## 2017-09-21 NOTE — Plan of Care (Signed)
Patient stable during 7 a to 7 p shift, continues to receive IV diuresis.  Patient denies pain or other. Family at bedside, able to speak with SW regarding placement.

## 2017-09-21 NOTE — Progress Notes (Signed)
PROGRESS NOTE    Shirley [name]  ASN:[id number] DOB: 05-07-1922 DOA: 09/13/2017 PCP: Jani Gravel, MD  Brief Narrative:  Shirley [name] 84 year old female with medical history significant for hypertension, hypothyroidism and her murmur presented to the emergency department after being seen by PCP for generalized weakness.  She was referred to the ER after was found with hemoglobin of 5.5.  Patient very poor recent black tarry stool.  Never had endoscopy or colonoscopy.  Denies NSAIDs use.  Upon ED evaluation hemoglobin 5.5, hypotensive and tachycardic.  Patient was admitted with working diagnosis of GI bleed.  Gastroenterology was consulted and patient was started on Protonix drip and transfuse.   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 . Transfuse to keep hemoglobin greater than 7.  Underwent a total of 4 units of prbc transfusion.  If she has further bleeding, plan for colonoscopy vs capsule endoscopy.  Hemoglobin is currently stable at 10.   Severe AS: Cardiology consulted, not a candidate for TAVR. Palliative care consulted in view of the multiple medical problems.  Patient's son would like to continue with aggressive management at this time and take her home on discharge   Acute diastolic heart failure due to TACO (transfusion associated circulatory overload) Fluid overloaded with urinary retention. S/p foley catheter.  Strict intake and output.  Daily weights.  Cardiology on board.  Resume IV diuretics.  With IV Lasix 20 mg twice daily.  Creatinine stable at 1.4 on IV diuresis. Urine output 122 5 mL over the last 24 hours.   PAF: In NSR resume metoprolol twice daily.  Not a candidate for ANTI CAOGULATION  given GI bleed.    Hypertension: well controlled.    Hypothyroidism:  Resume synthroid.    Acute on stage 3 CKD:  - improved.  Stabilized at 1.4.    Diffuse wheezing on exam change to Ventolin nebs to scheduled every 6 hours for the next 24 hours.       DVT prophylaxis: scd's/  Code Status: DNR. Family Communication: NONE AT BEDSIDE.  Disposition Plan: SNF on discharge.   Consultants:   Palliative care  Cardiology.  Procedures: none.      Antimicrobials: none.    Subjective: Patient reports having some shortness of breath earlier this morning  Objective: Vitals:   09/20/17 2041 09/21/17 0419 09/21/17 0441 09/21/17 1011  BP: 140/62  (!) 148/69 (!) 145/57  Pulse: 68  69 70  Resp: 16  18   Temp: 99.4 F (37.4 C)  97.8 F (36.6 C)   TempSrc: Oral  Oral   SpO2: 95%  96% 92%  Weight:  71.3 kg (157 lb 3 oz)    Height:        Intake/Output Summary (Last 24 hours) at 09/21/2017 1226 Last data filed at 09/21/2017 0736 Gross per 24 hour  Intake 560 ml  Output 1325 ml  Net -765 ml   Filed Weights   09/19/17 0520 09/20/17 0500 09/21/17 0419  Weight: 72.6 kg (160 lb 0.9 oz) 76.6 kg (168 lb 14 oz) 71.3 kg (157 lb 3 oz)    Examination:  General exam: Appears calm and comfortable on 2 L nasal count oxygen Respiratory system: Crackles at bases with bilateral  scattered wheezing. Cardiovascular system: S1 & S2 heard, RRR. Murmer present.  Gastrointestinal system: Abdomen is soft non tender non distended bowel sounds heard.  Central nervous system: Alert and able to answer simple questions.  Extremities: mild pedal edema.  Skin: No rashes, lesions or ulcers Psychiatry:  Mood & affect appropriate.     Data Reviewed: I have personally reviewed following labs and imaging studies  CBC: Recent Labs  Lab 09/15/17 0413  09/16/17 0414 09/16/17 0844 09/17/17 0437 09/18/17 0813 09/20/17 0412  WBC 8.0  --  10.4  --  6.7 5.8 5.1  NEUTROABS  --   --  8.9*   --   --  4.5 3.9  HGB 6.8*   < > 12.1 12.1 10.3* 10.8* 10.3*  HCT 21.5*   < > 36.3 36.8 32.2* 34.4* 32.2*  MCV 93.5  --  89.9  --  92.0 93.0 92.0  PLT 204  --  230  --  205 198 228   < > = values in this interval not displayed.   Basic Metabolic Panel: Recent Labs  Lab 09/15/17 0413 09/16/17 0414 09/17/17 0437 09/18/17 0813 09/19/17 0504 09/20/17 0412 09/21/17 0412  NA 141 139 139 139 139 137 137  K 3.6 4.3 4.0 3.6 4.1 3.8 3.7  CL 112* 107 107 104 105 103 101  CO2 22 21* 23 23 25 23 25   GLUCOSE 97 123* 124* 132* 129* 112* 109*  BUN 39* 25* 24* 30* 35* 34* 39*  CREATININE 1.10* 1.14* 1.26* 1.54* 1.44* 1.41* 1.49*  CALCIUM 8.0* 8.7* 8.5* 8.3* 8.5* 8.0* 8.3*  MG 1.9 1.9  --   --   --  1.8  --    GFR: Estimated Creatinine Clearance: 22.3 mL/min (A) (by C-G formula based on SCr of 1.49 mg/dL (H)). Liver Function Tests: No results for input(s): AST, ALT, ALKPHOS, BILITOT, PROT, ALBUMIN in the last 168 hours. No results for input(s): LIPASE, AMYLASE in the last 168 hours. No results for input(s): AMMONIA in the last 168 hours. Coagulation Profile: No results for input(s): INR, PROTIME in the last 168 hours. Cardiac Enzymes: No results for input(s): CKTOTAL, CKMB, CKMBINDEX, TROPONINI in the last 168 hours. BNP (last 3 results) No results for input(s): PROBNP in the last 8760 hours. HbA1C: No results for input(s): HGBA1C in the last 72 hours. CBG: No results for input(s): GLUCAP in the last 168 hours. Lipid Profile: No results for input(s): CHOL, HDL, LDLCALC, TRIG, CHOLHDL, LDLDIRECT in the last 72 hours. Thyroid Function Tests: No results for input(s): TSH, T4TOTAL, FREET4, T3FREE, THYROIDAB in the last 72 hours. Anemia Panel: No results for input(s): VITAMINB12, FOLATE, FERRITIN, TIBC, IRON, RETICCTPCT in the last 72 hours. Sepsis Labs: Recent Labs  Lab 09/17/17 1149  PROCALCITON <0.10    No results found for this or any previous visit (from the past 240 hour(s)).         Radiology Studies: No results found.      Scheduled Meds: . feeding supplement  1 Container Oral TID BM  . ferrous sulfate  325 mg Oral QPM  . furosemide  20 mg Intravenous BID  . ipratropium-albuterol  3 mL Nebulization Q6H  . levothyroxine  50 mcg Oral QAC breakfast  . metoprolol tartrate  25 mg Oral BID  . pantoprazole  40 mg Oral Daily  . risperiDONE  0.5 mg Oral BID  . simvastatin  40 mg Oral Daily  . tamsulosin  0.4 mg Oral QPC breakfast   Continuous  Infusions:   LOS: 8 days    Time spent: 30 minutes.     Hosie Poisson, MD [facility] Pager [phone]  If 7PM-7AM, please contact night-coverage [email or web address] Password [facility] 09/21/2017, 12:26 PM

## 2017-09-22 ENCOUNTER — Inpatient Hospital Stay (HOSPITAL_COMMUNITY): Payer: Medicare Other

## 2017-09-22 LAB — CBC WITH DIFFERENTIAL/PLATELET
BASOS ABS: 0 10*3/uL (ref 0.0–0.1)
Basophils Relative: 0 %
Eosinophils Absolute: 0 10*3/uL (ref 0.0–0.7)
Eosinophils Relative: 0 %
HEMATOCRIT: 34.2 % — AB (ref 36.0–46.0)
Hemoglobin: 10.9 g/dL — ABNORMAL LOW (ref 12.0–15.0)
Lymphocytes Relative: 9 %
Lymphs Abs: 0.9 10*3/uL (ref 0.7–4.0)
MCH: 29.4 pg (ref 26.0–34.0)
MCHC: 31.9 g/dL (ref 30.0–36.0)
MCV: 92.2 fL (ref 78.0–100.0)
Monocytes Absolute: 0.7 10*3/uL (ref 0.1–1.0)
Monocytes Relative: 7 %
NEUTROS ABS: 8 10*3/uL — AB (ref 1.7–7.7)
Neutrophils Relative %: 84 %
Platelets: 218 10*3/uL (ref 150–400)
RBC: 3.71 MIL/uL — ABNORMAL LOW (ref 3.87–5.11)
RDW: 16.3 % — AB (ref 11.5–15.5)
WBC: 9.6 10*3/uL (ref 4.0–10.5)

## 2017-09-22 LAB — MRSA PCR SCREENING: MRSA by PCR: NEGATIVE

## 2017-09-22 LAB — URINALYSIS, ROUTINE W REFLEX MICROSCOPIC
BILIRUBIN URINE: NEGATIVE
Glucose, UA: NEGATIVE mg/dL
Ketones, ur: NEGATIVE mg/dL
NITRITE: NEGATIVE
PH: 5 (ref 5.0–8.0)
PROTEIN: NEGATIVE mg/dL
Specific Gravity, Urine: 1.014 (ref 1.005–1.030)

## 2017-09-22 MED ORDER — SODIUM CHLORIDE 0.9 % IV SOLN
1.0000 g | INTRAVENOUS | Status: DC
  Administered 2017-09-23 – 2017-09-24 (×2): 1 g via INTRAVENOUS
  Filled 2017-09-22 (×3): qty 1

## 2017-09-22 MED ORDER — METHYLPREDNISOLONE SODIUM SUCC 125 MG IJ SOLR
60.0000 mg | Freq: Once | INTRAMUSCULAR | Status: AC
  Administered 2017-09-22: 60 mg via INTRAVENOUS
  Filled 2017-09-22: qty 2

## 2017-09-22 MED ORDER — SODIUM CHLORIDE 0.9 % IV SOLN
2.0000 g | Freq: Once | INTRAVENOUS | Status: AC
  Administered 2017-09-22: 2 g via INTRAVENOUS
  Filled 2017-09-22: qty 2

## 2017-09-22 MED ORDER — SENNOSIDES-DOCUSATE SODIUM 8.6-50 MG PO TABS: 2.0000 | ORAL_TABLET | Freq: Every day | ORAL | Status: DC | PRN

## 2017-09-22 MED ORDER — IPRATROPIUM-ALBUTEROL 0.5-2.5 (3) MG/3ML IN SOLN
3.0000 mL | Freq: Four times a day (QID) | RESPIRATORY_TRACT | Status: DC
  Administered 2017-09-22 – 2017-09-23 (×4): 3 mL via RESPIRATORY_TRACT
  Filled 2017-09-22 (×4): qty 3

## 2017-09-22 MED ORDER — FUROSEMIDE 20 MG PO TABS
20.0000 mg | ORAL_TABLET | Freq: Two times a day (BID) | ORAL | Status: DC
  Administered 2017-09-22 – 2017-09-26 (×8): 20 mg via ORAL
  Filled 2017-09-22 (×8): qty 1

## 2017-09-22 MED ORDER — ENSURE ENLIVE PO LIQD
237.0000 mL | Freq: Three times a day (TID) | ORAL | Status: DC
  Administered 2017-09-22 – 2017-09-25 (×12): 237 mL via ORAL

## 2017-09-22 MED ORDER — VANCOMYCIN HCL 10 G IV SOLR
1250.0000 mg | Freq: Once | INTRAVENOUS | Status: AC
  Administered 2017-09-22: 1250 mg via INTRAVENOUS
  Filled 2017-09-22: qty 1250

## 2017-09-22 MED ORDER — VANCOMYCIN HCL IN DEXTROSE 750-5 MG/150ML-% IV SOLN
750.0000 mg | INTRAVENOUS | Status: DC
  Administered 2017-09-24: 750 mg via INTRAVENOUS
  Filled 2017-09-22: qty 150

## 2017-09-22 NOTE — Progress Notes (Signed)
Physical Therapy Treatment Patient Details Name: Shirley [name] MRN: [id number] DOB: 12/01/1921 Today's Date: 09/22/2017    History of Present Illness 84yo female admitted secondary to 2 week history of black tarry stools, weakness, and anemia with HgB 5.5. 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 PMH gout, HTN, hypothyroidism     PT Comments    Pt OOB in recliner on 2 lts nasal at 98%.  Removed oxygen for trial.  Assisted out of recliner to [facility] per pt request to have a BM.  Pt is used to her Lift Chair at home.  Required increased assist to rise and present with severe posterior lean.  Assisted off BSC and assisted with peri care as pt was unable to perform and maintain safe standing balance.  Assisted with amb only 5 feet due to weakness and fatigue.  RA remained 95% with mild dyspnea noted.  Pt present with poor forward flex posture and inability to self correct.  HIGH FALL RISK.  Positioned in recliner and notified RN pt was on room air.  Follow Up Recommendations  SNF(per chart review, son declines SNF and plans to take pt home)     Equipment Recommendations       Recommendations for Other Services       Precautions / Restrictions Precautions Precautions: Fall Restrictions Weight Bearing Restrictions: No    Mobility  Bed Mobility               General bed mobility comments: OOB in recliner  Transfers Overall transfer level: Needs assistance Equipment used: None Transfers: Sit to/from [employer] Transfers Sit to Stand: Mod assist;+2 safety/equipment;+2 physical assistance Stand pivot transfers: Max assist;+2 physical assistance;+2 safety/equipment       General transfer comment: pt is used to her Lift Chair at home.  75% VC's on proper hand placement to place back to assist with pushing self up to rise and lower self to control.  Severe posterior lean with inability to self correct.  Poor forward flex posture.  Assisted from recliner to [facility]  + 2 assist for safety due to weakness and poor balance.    Ambulation/Gait Ambulation/Gait assistance: Mod assist;Min assist Ambulation Distance (Feet): 5 Feet Assistive device: Rolling walker (2 wheeled) Gait Pattern/deviations: Step-to pattern;Step-through pattern;Decreased step length - right;Decreased step length - left;Shuffle;Trunk flexed Gait velocity: decreased   General Gait Details: very poor standing balance of forward flex but still posterior lean.  Limited distance due to weakness/fatigue.  Recliner following closely for safety.     Stairs             Wheelchair Mobility    Modified Rankin (Stroke Patients Only)       Balance                                            Cognition Arousal/Alertness: Awake/alert Behavior During Therapy: WFL for tasks assessed/performed Overall Cognitive Status: Within Functional Limits for tasks assessed                                 General Comments: AxO x 4    much improved      Exercises      General Comments        Pertinent Vitals/Pain Pain Assessment: No/denies pain    Home Living  Prior Function            PT Goals (current goals can now be found in the care plan section) Progress towards PT goals: Progressing toward goals    Frequency    Min 2X/week      PT Plan Current plan remains appropriate    Co-evaluation              AM-PAC PT "6 Clicks" Daily Activity  Outcome Measure  Difficulty turning over in bed (including adjusting bedclothes, sheets and blankets)?: A Lot Difficulty moving from lying on back to sitting on the side of the bed? : A Lot Difficulty sitting down on and standing up from a chair with arms (e.g., wheelchair, bedside commode, etc,.)?: A Lot Help needed moving to and from a bed to chair (including a wheelchair)?: A Lot Help needed walking in hospital room?: A Lot Help needed climbing 3-5 steps with a  railing? : Total 6 Click Score: 11    End of Session Equipment Utilized During Treatment: Gait belt Activity Tolerance: Patient limited by fatigue;Treatment limited secondary to medical complications (Comment) Patient left: in chair;with call bell/phone within reach;with chair alarm set Nurse Communication: Mobility status PT Visit Diagnosis: Muscle weakness (generalized) (M62.81);Difficulty in walking, not elsewhere classified (R26.2);Unsteadiness on feet (R26.81)     Time: 3005-1102 PT Time Calculation (min) (ACUTE ONLY): 23 min  Charges:  $Gait Training: 8-22 mins $Therapeutic Activity: 8-22 mins                    G Codes:       Rica Koyanagi  PTA [facility]  Acute  Rehab Pager      [phone]

## 2017-09-22 NOTE — NC FL2 (Signed)
[place] LEVEL OF CARE SCREENING TOOL     IDENTIFICATION  Patient Name: Shirley [name] Birthdate: 09/25/1921 Sex: female Admission Date (Current Location): 09/13/2017  [facility] and [place] Number:  Herbalist and Address:  [facility],  [place] [address] [place]      Provider Number: [id number]  Attending Physician Name and Address:  Hosie Poisson, MD  Relative Name and Phone Number:       Current Level of Care: Hospital Recommended Level of Care: [place] Prior Approval Number:    Date Approved/Denied:   PASRR Number: [id number]  Discharge Plan: SNF    Current Diagnoses: Patient Active Problem List   Diagnosis Date Noted  . Acute diastolic CHF (congestive heart failure) ([place]) 09/18/2017  . Atrial fibrillation with rapid ventricular response ([place]) 09/18/2017  . Aortic stenosis, severe   . GI bleed 09/13/2017    Orientation RESPIRATION BLADDER Height & Weight     Self, Time, Situation, Place  O2 Indwelling catheter Weight: 158 lb 15.2 oz (72.1 kg) Height:  5\' 4"  (162.6 cm)  BEHAVIORAL SYMPTOMS/MOOD NEUROLOGICAL BOWEL NUTRITION STATUS        Diet(see dc summary)  AMBULATORY STATUS COMMUNICATION OF NEEDS Skin   Extensive Assist Verbally Normal                       Personal Care Assistance Level of Assistance  Bathing, Feeding, Dressing Bathing Assistance: Maximum assistance Feeding assistance: Independent Dressing Assistance: Maximum assistance     Functional Limitations Info  Sight, Hearing, Speech Sight Info: Impaired(patient is legally blind) Hearing Info: Impaired Speech Info: Adequate    SPECIAL CARE FACTORS FREQUENCY  PT (By licensed PT), OT (By licensed OT)     PT Frequency: 5x/week OT Frequency: 5x/week            Contractures Contractures Info: Not present    Additional Factors Info  Code Status, Allergies Code Status Info: DNR Allergies Info: NKA          Current Medications (09/22/2017):  This is the current hospital active medication list Current Facility-Administered Medications  Medication Dose Route Frequency Provider Last Rate Last Dose  . acetaminophen (TYLENOL) tablet 1,000 mg  1,000 mg Oral Q6H PRN Patrecia Pour, Christean Grief, MD   1,000 mg at 09/20/17 2044  . albuterol (PROVENTIL) (2.5 MG/3ML) 0.083% nebulizer solution 2.5 mg  2.5 mg Nebulization Q4H PRN Hosie Poisson, MD      . feeding supplement (BOOST / RESOURCE BREEZE) liquid 1 Container  1 Container Oral TID BM Wilford Corner, MD   1 Container at 09/21/17 2304  . ferrous sulfate tablet 325 mg  325 mg Oral QPM Wilford Corner, MD   325 mg at 09/21/17 1732  . furosemide (LASIX) injection 20 mg  20 mg Intravenous BID Patrecia Pour, Christean Grief, MD   20 mg at 09/21/17 1732  . haloperidol lactate (HALDOL) injection 5 mg  5 mg Intravenous Q6H PRN Bodenheimer, Charles A, NP   5 mg at 09/16/17 2010  . hydrocortisone cream 1 %   Topical PRN Patrecia Pour, Christean Grief, MD      . ipratropium-albuterol (DUONEB) 0.5-2.5 (3) MG/3ML nebulizer solution 3 mL  3 mL Nebulization TID Hosie Poisson, MD   3 mL at 09/22/17 0752  . levothyroxine (SYNTHROID, LEVOTHROID) tablet 50 mcg  50 mcg Oral QAC breakfast Wilford Corner, MD   50 mcg at 09/21/17 1751  . meclizine (ANTIVERT) tablet 25 mg  25 mg Oral TID PRN Wilford Corner, MD      . metoprolol tartrate (LOPRESSOR) tablet 25 mg  25 mg Oral BID Patrecia Pour, Christean Grief, MD   25 mg at 09/21/17 2246  . pantoprazole (PROTONIX) EC tablet 40 mg  40 mg Oral Daily Clarene Essex, MD   40 mg at 09/21/17 1020  . polyethylene glycol (MIRALAX / GLYCOLAX) packet 17 g  17 g Oral Daily PRN Hosie Poisson, MD      . risperiDONE (RISPERDAL) tablet 0.5 mg  0.5 mg Oral BID Patrecia Pour, Christean Grief, MD   0.5 mg at 09/21/17 2245  . senna-docusate (Senokot-S) tablet 2 tablet  2 tablet Oral BID Hosie Poisson, MD   2 tablet at 09/21/17 2246  . simvastatin (ZOCOR) tablet 40 mg  40 mg Oral Daily  Wilford Corner, MD   40 mg at 09/21/17 1020  . tamsulosin (FLOMAX) capsule 0.4 mg  0.4 mg Oral QPC breakfast Patrecia Pour, Christean Grief, MD   0.4 mg at 09/21/17 1019     Discharge Medications: Please see discharge summary for a list of discharge medications.  Relevant Imaging Results:  Relevant Lab Results:   Additional Information SSN [id number]  Burnis Medin, LCSW

## 2017-09-22 NOTE — Progress Notes (Signed)
Pharmacy Antibiotic Note  Shirley [name] is a 84 y.o. female admitted on 09/13/2017 with anemia.  Pharmacy has been consulted for Vancomycin & Cefepime dosing.  Plan: Cefepime 2gm x1, followed by 1gm q24 hr Vancomycin 1250mg  x1, followed by 750mg  q48 hr to deliver AUC 446, using TBW/ABW, SCr 1.49  Height: 5\' 4"  (162.6 cm) Weight: 158 lb 15.2 oz (72.1 kg) IBW/kg (Calculated) : 54.7  Temp (24hrs), Avg:99.7 F (37.6 C), Min:99.4 F (37.4 C), Max:100.1 F (37.8 C)  Recent Labs  Lab 09/16/17 0414 09/17/17 0437 09/18/17 0813 09/19/17 0504 09/20/17 0412 09/21/17 0412  WBC 10.4 6.7 5.8  --  5.1  --   CREATININE 1.14* 1.26* 1.54* 1.44* 1.41* 1.49*    Estimated Creatinine Clearance: 22.5 mL/min (A) (by C-G formula based on SCr of 1.49 mg/dL (H)).    No Known Allergies  Antimicrobials this admission: 4/26 Cefepime >>  4/26 Vancomycin >>   Dose adjustments this admission:  Microbiology results: 4/26 UCx: ordered, needs collection  4/26 MRSA PCR: sent  Thank you for allowing pharmacy to be a part of this patient's care.  Minda Ditto 09/22/2017 1:11 PM

## 2017-09-22 NOTE — Progress Notes (Signed)
   09/22/17 1458  Clinical Encounter Type  Visited With Patient;Health care provider  Visit Type Initial;Spiritual support  Spiritual Encounters  Spiritual Needs Emotional  Stress Factors  Patient Stress Factors Health changes   Rounding on Palliative Patients.  Patient was sitting up in the chair and glad to be out of the bed.  Indicated she lives with her son and his wife.  Moved down here 2.5 years ago to live with her son.  Says she hopes she is feeling better and anxious about going home, but also anxious about what is going on with her health.  She was brought up in a faith community and believes about where she will go when she dies. She says she is not afraid to die and that then she will be ok.  We prayed together.  Will follow and support as needed.   Chaplain Katherene Ponto

## 2017-09-22 NOTE — Clinical Social Work Note (Signed)
Clinical Social Work Assessment  Patient Details  Name: Shirley [name] MRN: [id number] Date of Birth: 05-18-1922  Date of referral:  09/21/17               Reason for consult:  Facility Placement                Permission sought to share information with:  Facility Sport and exercise psychologist, Family Supports Permission granted to share information::  Yes, Verbal Permission Granted  Name::     Research officer, trade union::     Relationship::  son  Contact Information:  [phone]  Housing/Transportation Living arrangements for the past 2 months:  [place] of Information:  Patient, Adult Children Patient Interpreter Needed:  None Criminal Activity/Legal Involvement Pertinent to Current Situation/Hospitalization:  No - Comment as needed Significant Relationships:  Adult Children Lives with:  Adult Children Do you feel safe going back to the place where you live?  (PT recommending SNF) Need for family participation in patient care:  Yes (Comment)  Care giving concerns:  Patient from home with son and daughter-in-law. Patient's daughter-in-law reported that prior to patient's weakness and hospitalization patient was walking independent with no equipment. Patient's daughter-in-law reported that patient needed a little assistance with bathing. Patient's family inquired about DME (shower chair and bedside commode), CSW agreed to follow up with patient's RNCM. PT recommending SNF for ST rehab.   Social Worker assessment / plan:  CSW spoke with patient/patient's son and daughter in law at bedside regarding PT recommendation for SNF. CSW explained SNF placement process and medicare coverage, patient/patient's family agreeable to [place] rehab at [facility]. Patient's son reported that their first preference is [facility] because of the close proximity to their home and patient's daughter-in-law's mother has been to [facility] SNF. Patient's son reported that their second  preference is [facility] SNF. CSW agreed to follow up with preferred SNFs.   CSW will complete FL2 and will follow up with preferred SNFs.  CSW asked patient's RNCM about patient's requested DME, RNCM reported that the items requested were out of pocket expenses and suggested that patient's family follow up with SNF to obtain DME for home. CSW agreed to update patient's family.  CSW will continue to follow and assist with discharge planning.  Employment status:  Retired Forensic scientist:  Medicare PT Recommendations:  [place] / Referral to community resources:  [place]  Patient/Family's Response to care:  Patient/patient's family appreciative of CSW assistance with discharge planning.   Patient/Family's Understanding of and Emotional Response to Diagnosis, Current Treatment, and Prognosis:  Patient presented calm and was quiet during most of the assessment. Patient's son/daughter-in-law involved in patient's care and verbalized strond understanding of diagnosis and current treatment. Patient/patient's family verbalized plan for patient to return home after ST rehab at [facility].   Emotional Assessment Appearance:  Appears stated age Attitude/Demeanor/Rapport:  Other(Cooperative) Affect (typically observed):  Calm, Quiet Orientation:  Oriented to Self, Oriented to Situation, Oriented to Place, Oriented to  Time Alcohol / Substance use:  Not Applicable Psych involvement (Current and /or in the community):  No (Comment)  Discharge Needs  Concerns to be addressed:  Care Coordination Readmission within the last 30 days:  No Current discharge risk:  Physical Impairment Barriers to Discharge:  Continued Medical Work up   [employer], LCSW 09/22/2017, 8:50 AM

## 2017-09-22 NOTE — Progress Notes (Signed)
PROGRESS NOTE    Shirley [name]  LZJ:[id number] DOB: 09-15-1921 DOA: 09/13/2017 PCP: Jani Gravel, MD  Brief Narrative:  Shirley [name] 84 year old female with medical history significant for hypertension, hypothyroidism and her murmur presented to the emergency department after being seen by PCP for generalized weakness.  She was referred to the ER after was found with hemoglobin of 5.5.  Patient very poor recent black tarry stool.  Never had endoscopy or colonoscopy.  Denies NSAIDs use.  Upon ED evaluation hemoglobin 5.5, hypotensive and tachycardic.  Patient was admitted with working diagnosis of GI bleed.  Gastroenterology was consulted and patient was started on Protonix drip and transfuse.   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 . Transfuse to keep hemoglobin greater than 7.  Underwent a total of 4 units of prbc transfusion.  If she has further bleeding, plan for colonoscopy vs capsule endoscopy.  Hemoglobin is currently stable at 10.   Severe AS: Cardiology consulted, not a candidate for TAVR. Palliative care consulted in view of the multiple medical problems.  Patient's son would like to continue with aggressive management at this time and take her home on discharge.    Acute diastolic heart failure due to TACO (transfusion associated circulatory overload) Fluid overloaded with urinary retention. S/p foley catheter.  Strict intake and output.  Daily weights.  Started on IV lasix 20 mg BID, transitioned to oral lasix today.  Resume metoprolol 25 mg bID.    PAF: In NSR resume metoprolol twice daily.  Not a candidate for ANTI COAGULATION given GI bleed.    Hypertension: well controlled.     Hypothyroidism:  Resume synthroid.    Acute on stage 3 CKD:  - improved.  Stabilized at 1.4.   Acute respiratory failure with hypoxia secondary to possible COPD exacerbation. Patient does not have a history of COPD, but she has a history of smoking in the past and she has exposure to secondhand smoking.  She is currently diffusely wheezing at this time. Is on duo nebs every 6 hours scheduled.  The dose of Solu-Medrol today and get chest x-ray.  Chest x-ray showed bibasilar airspace disease we will start her on broad-spectrum IV antibiotics and monitor.   Son these concerned and reports some hallucination and delirium earlier this morning.  Suspect this is due to infection versus hospital-acquired delirium.  Continue to monitor.  Eye exam patient was alert and oriented to place person and time.    DVT prophylaxis: scd's/  Code Status: DNR. Family Communication: Family at bedside disposition Plan: SNF on discharge.   Consultants:   Palliative care  Cardiology.  Procedures: none.      Antimicrobials: none.    Subjective: Reports the same , no chest pain, some sob.  No nausea, vomiting.   Objective: Vitals:   09/22/17 0639 09/22/17 0752 09/22/17 1056 09/22/17 1400  BP: (!) 89/62   (!) 134/47  Pulse: 72   72  Resp: 20   20  Temp: 100.1 F (37.8 C)   99 F (37.2 C)  TempSrc: Oral   Oral  SpO2: 94% 93% 97%   Weight: 72.1 kg (158 lb 15.2 oz)     Height:  Intake/Output Summary (Last 24 hours) at 09/22/2017 1401 Last data filed at 09/22/2017 0846 Gross per 24 hour  Intake 222 ml  Output 600 ml  Net -378 ml   Filed Weights   09/20/17 0500 09/21/17 0419 09/22/17 0639  Weight: 76.6 kg (168 lb 14 oz) 71.3 kg (157 lb 3 oz) 72.1 kg (158 lb 15.2 oz)    Examination:  General exam: Appears calm and comfortable on 2 L nasal count oxygen Respiratory system: Bilateral diffuse wheezing heard, no rhonchi diminished at bases Cardiovascular system: S1 & S2  heard, RRR. Murmer present.  Gastrointestinal system: Abdomen  Soft , NT ND BS+ Central nervous system: Alert and able to answer simple questions.  Extremities: NO cyanosis or clubbing.  Skin: No rashes, lesions or ulcers Psychiatry:  Mood & affect appropriate.     Data Reviewed: I have personally reviewed following labs and imaging studies  CBC: Recent Labs  Lab 09/16/17 0414 09/16/17 0844 09/17/17 0437 09/18/17 0813 09/20/17 0412  WBC 10.4  --  6.7 5.8 5.1  NEUTROABS 8.9*  --   --  4.5 3.9  HGB 12.1 12.1 10.3* 10.8* 10.3*  HCT 36.3 36.8 32.2* 34.4* 32.2*  MCV 89.9  --  92.0 93.0 92.0  PLT 230  --  205 198 834   Basic Metabolic Panel: Recent Labs  Lab 09/16/17 0414 09/17/17 0437 09/18/17 0813 09/19/17 0504 09/20/17 0412 09/21/17 0412  NA 139 139 139 139 137 137  K 4.3 4.0 3.6 4.1 3.8 3.7  CL 107 107 104 105 103 101  CO2 21* 23 23 25 23 25   GLUCOSE 123* 124* 132* 129* 112* 109*  BUN 25* 24* 30* 35* 34* 39*  CREATININE 1.14* 1.26* 1.54* 1.44* 1.41* 1.49*  CALCIUM 8.7* 8.5* 8.3* 8.5* 8.0* 8.3*  MG 1.9  --   --   --  1.8  --    GFR: Estimated Creatinine Clearance: 22.5 mL/min (A) (by C-G formula based on SCr of 1.49 mg/dL (H)). Liver Function Tests: No results for input(s): AST, ALT, ALKPHOS, BILITOT, PROT, ALBUMIN in the last 168 hours. No results for input(s): LIPASE, AMYLASE in the last 168 hours. No results for input(s): AMMONIA in the last 168 hours. Coagulation Profile: No results for input(s): INR, PROTIME in the last 168 hours. Cardiac Enzymes: No results for input(s): CKTOTAL, CKMB, CKMBINDEX, TROPONINI in the last 168 hours. BNP (last 3 results) No results for input(s): PROBNP in the last 8760 hours. HbA1C: No results for input(s): HGBA1C in the last 72 hours. CBG: No results for input(s): GLUCAP in the last 168 hours. Lipid Profile: No results for input(s): CHOL, HDL, LDLCALC, TRIG, CHOLHDL, LDLDIRECT in the last 72 hours. Thyroid Function  Tests: No results for input(s): TSH, T4TOTAL, FREET4, T3FREE, THYROIDAB in the last 72 hours. Anemia Panel: No results for input(s): VITAMINB12, FOLATE, FERRITIN, TIBC, IRON, RETICCTPCT in the last 72 hours. Sepsis Labs: Recent Labs  Lab 09/17/17 1149  PROCALCITON <0.10    No results found for this or any previous visit (from the past 240 hour(s)).       Radiology Studies: Dg Chest Port 1 View  Result Date: 09/22/2017 CLINICAL DATA:  Shortness of breath, history of CHF and atrial fibrillation EXAM: PORTABLE CHEST 1 VIEW COMPARISON:  Portable chest x-ray of 09/18/2017 FINDINGS: There is little change in airspace disease at both lung bases. This appears more than would be expected with just atelectasis, and bibasilar pneumonia is a consideration. Small pleural effusions cannot be excluded.  Mild cardiomegaly is stable. Moderate changes of thoracic aortic atherosclerosis are noted. Significant degenerative changes present within the left shoulder. IMPRESSION: 1. Persistent bibasilar airspace disease suspicious for pneumonia. Small effusions and mild atelectasis may also be present. 2. Stable cardiomegaly. 3. Significant degenerative change in the left shoulder. Electronically Signed   By: Ivar Drape M.D.   On: 09/22/2017 11:22        Scheduled Meds: . feeding supplement (ENSURE ENLIVE)  237 mL Oral TID BM  . ferrous sulfate  325 mg Oral QPM  . furosemide  20 mg Oral BID  . ipratropium-albuterol  3 mL Nebulization Q6H  . levothyroxine  50 mcg Oral QAC breakfast  . metoprolol tartrate  25 mg Oral BID  . pantoprazole  40 mg Oral Daily  . risperiDONE  0.5 mg Oral BID  . simvastatin  40 mg Oral Daily  . tamsulosin  0.4 mg Oral QPC breakfast   Continuous Infusions: . ceFEPime (MAXIPIME) IV 2 g (09/22/17 1343)  . vancomycin       LOS: 9 days    Time spent: 30 minutes.     Hosie Poisson, MD [facility] Pager [phone]  If 7PM-7AM, please contact  night-coverage [email or web address] Password [email or web address] 09/22/2017, 2:01 PM

## 2017-09-22 NOTE — Progress Notes (Signed)
Pt is up in the chair, decreased congestion noted after Lasix , O2 on RA 96%, MRSA PCR negative, Urine specimen sent to lab as ordered. Pt appear to be more alert this afternoon, able to eat  dinner with out issues. Will cont to monitor. SRP, RN

## 2017-09-23 LAB — CREATININE, SERUM
CREATININE: 1.39 mg/dL — AB (ref 0.44–1.00)
GFR calc Af Amer: 36 mL/min — ABNORMAL LOW (ref >60)
GFR, EST NON AFRICAN AMERICAN: 31 mL/min — AB (ref >60)

## 2017-09-23 MED ORDER — IPRATROPIUM-ALBUTEROL 0.5-2.5 (3) MG/3ML IN SOLN
3.0000 mL | Freq: Three times a day (TID) | RESPIRATORY_TRACT | Status: DC
  Administered 2017-09-23 – 2017-09-26 (×9): 3 mL via RESPIRATORY_TRACT
  Filled 2017-09-23 (×9): qty 3

## 2017-09-23 MED ORDER — PREDNISONE 50 MG PO TABS
60.0000 mg | ORAL_TABLET | Freq: Every day | ORAL | Status: DC
  Administered 2017-09-23 – 2017-09-24 (×2): 60 mg via ORAL
  Filled 2017-09-23 (×2): qty 1

## 2017-09-23 NOTE — Progress Notes (Signed)
PROGRESS NOTE    Shirley [name]  NAT:[id number] DOB: 06/28/1921 DOA: 09/13/2017 PCP: Jani Gravel, MD  Brief Narrative:  Shirley [name] 84 year old female with medical history significant for hypertension, hypothyroidism and her murmur presented to the emergency department after being seen by PCP for generalized weakness.  She was referred to the ER after was found with hemoglobin of 5.5.  Patient very poor recent black tarry stool.  Never had endoscopy or colonoscopy.  Denies NSAIDs use.  Upon ED evaluation hemoglobin 5.5, hypotensive and tachycardic.  Patient was admitted with working diagnosis of GI bleed.  Gastroenterology was consulted and patient was started on Protonix drip and transfuse.   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 . Transfuse to keep hemoglobin greater than 7.  Underwent a total of 4 units of prbc transfusion.  If she has further bleeding, plan for colonoscopy vs capsule endoscopy.  Hemoglobin is currently stable at 10. No active issues.    Severe AS: Cardiology consulted, not a candidate for TAVR. Palliative care consulted in view of the multiple medical problems.  Patient's son would like to continue with aggressive management at this time and take her home on discharge.    Acute diastolic heart failure due to TACO (transfusion associated circulatory overload) Fluid overloaded with urinary retention. S/p foley catheter.  Strict intake and output.  Daily weights.  Started on IV lasix 20 mg BID, transitioned to oral lasix and creatinine stable around 1.3. Resume metoprolol 25 mg bID.    PAF: In NSR resume metoprolol twice daily.  Not a candidate for ANTI COAGULATION given GI bleed.     Hypertension: well controlled.    Hypothyroidism:  Resume synthroid.    Acute on stage 3 CKD:  - improved.  Stabilized at 1.3  Acute respiratory failure with hypoxia secondary to possible COPD exacerbation. Patient does not have a history of COPD, but she has a history of smoking in the past and she has exposure to secondhand smoking. She was wheezing diffuzely but much improved at this time.  Is on duo nebs every 6 hours scheduled.  On prednisone and will do a quick taper over the next few days.  Chest x-ray showed bibasilar airspace disease we will start her on broad-spectrum IV antibiotics and monitor.   Son  concerned and reports some hallucination and delirium earlieryesterday Suspect this is due to infection versus hospital-acquired delirium.  Continue to monitor.  Today she is more alert and talking .     DVT prophylaxis: scd's/  Code Status: DNR. Family Communication: none. at bedside  disposition Plan: SNF on discharge.   Consultants:   Palliative care  Cardiology.  Procedures: none.      Antimicrobials: none.    Subjective: =no chest pain or sob, no nausea, or vomiting or abdominal pain.  Objective: Vitals:   09/23/17 0107 09/23/17 0506 09/23/17 0807 09/23/17 1316  BP:  (!) 124/56  (!) 124/47  Pulse:  94  70  Resp:  20  (!) 22  Temp:  98 F (36.7 C)  (!) 97.5 F (36.4 C)  TempSrc:  Oral  Oral  SpO2: 94% 94% 90% 95%  Weight:  70.1 kg (154 lb 8.7 oz)  Height:        Intake/Output Summary (Last 24 hours) at 09/23/2017 1328 Last data filed at 09/23/2017 1255 Gross per 24 hour  Intake 570 ml  Output 700 ml  Net -130 ml   Filed Weights   09/21/17 0419 09/22/17 0639 09/23/17 0506  Weight: 71.3 kg (157 lb 3 oz) 72.1 kg (158 lb 15.2 oz) 70.1 kg (154 lb 8.7 oz)    Examination:  General exam: Appears calm and comfortable, not in distress.  Respiratory system: wheezing improved air entry fair. .  Cardiovascular system: S1 & S2 heard, RRR.  Murmer present.  Gastrointestinal system: Abdomen  Soft ,non tender non distended bowel sounds heard.  Central nervous system: Alert and able to answer simple questions.  Extremities: NO cyanosis or clubbing.  Skin: No rashes, lesions or ulcers Psychiatry:  Mood & affect appropriate.     Data Reviewed: I have personally reviewed following labs and imaging studies  CBC: Recent Labs  Lab 09/17/17 0437 09/18/17 0813 09/20/17 0412 09/22/17 1418  WBC 6.7 5.8 5.1 9.6  NEUTROABS  --  4.5 3.9 8.0*  HGB 10.3* 10.8* 10.3* 10.9*  HCT 32.2* 34.4* 32.2* 34.2*  MCV 92.0 93.0 92.0 92.2  PLT 205 198 228 831   Basic Metabolic Panel: Recent Labs  Lab 09/17/17 0437 09/18/17 0813 09/19/17 0504 09/20/17 0412 09/21/17 0412 09/23/17 0402  NA 139 139 139 137 137  --   K 4.0 3.6 4.1 3.8 3.7  --   CL 107 104 105 103 101  --   CO2 23 23 25 23 25   --   GLUCOSE 124* 132* 129* 112* 109*  --   BUN 24* 30* 35* 34* 39*  --   CREATININE 1.26* 1.54* 1.44* 1.41* 1.49* 1.39*  CALCIUM 8.5* 8.3* 8.5* 8.0* 8.3*  --   MG  --   --   --  1.8  --   --    GFR: Estimated Creatinine Clearance: 23.8 mL/min (A) (by C-G formula based on SCr of 1.39 mg/dL (H)). Liver Function Tests: No results for input(s): AST, ALT, ALKPHOS, BILITOT, PROT, ALBUMIN in the last 168 hours. No results for input(s): LIPASE, AMYLASE in the last 168 hours. No results for input(s): AMMONIA in the last 168 hours. Coagulation Profile: No results for input(s): INR, PROTIME in the last 168 hours. Cardiac Enzymes: No results for input(s): CKTOTAL, CKMB, CKMBINDEX, TROPONINI in the last 168 hours. BNP (last 3 results) No results for input(s): PROBNP in the last 8760 hours. HbA1C: No results for input(s): HGBA1C in the last 72 hours. CBG: No results for input(s): GLUCAP in the last 168 hours. Lipid Profile: No results for input(s): CHOL, HDL, LDLCALC, TRIG, CHOLHDL, LDLDIRECT in the last 72 hours. Thyroid Function Tests: No results  for input(s): TSH, T4TOTAL, FREET4, T3FREE, THYROIDAB in the last 72 hours. Anemia Panel: No results for input(s): VITAMINB12, FOLATE, FERRITIN, TIBC, IRON, RETICCTPCT in the last 72 hours. Sepsis Labs: Recent Labs  Lab 09/17/17 1149  PROCALCITON <0.10    Recent Results (from the past 240 hour(s))  MRSA PCR Screening     Status: None   Collection Time: 09/22/17  1:50 PM  Result Value Ref Range Status   MRSA by PCR NEGATIVE NEGATIVE Final    Comment:        The GeneXpert MRSA Assay (FDA approved for NASAL specimens only), is one component of a comprehensive MRSA colonization surveillance program. It is not intended to diagnose MRSA infection nor to guide  or monitor treatment for MRSA infections. Performed at [facility], [place] [address], [place], [place] [address]          Radiology Studies: Dg Chest Port 1 View  Result Date: 09/22/2017 CLINICAL DATA:  Shortness of breath, history of CHF and atrial fibrillation EXAM: PORTABLE CHEST 1 VIEW COMPARISON:  Portable chest x-ray of 09/18/2017 FINDINGS: There is little change in airspace disease at both lung bases. This appears more than would be expected with just atelectasis, and bibasilar pneumonia is a consideration. Small pleural effusions cannot be excluded. Mild cardiomegaly is stable. Moderate changes of thoracic aortic atherosclerosis are noted. Significant degenerative changes present within the left shoulder. IMPRESSION: 1. Persistent bibasilar airspace disease suspicious for pneumonia. Small effusions and mild atelectasis may also be present. 2. Stable cardiomegaly. 3. Significant degenerative change in the left shoulder. Electronically Signed   By: Ivar Drape M.D.   On: 09/22/2017 11:22        Scheduled Meds: . feeding supplement (ENSURE ENLIVE)  237 mL Oral TID BM  . ferrous sulfate  325 mg Oral QPM  . furosemide  20 mg Oral BID  . ipratropium-albuterol  3 mL Nebulization TID  . levothyroxine   50 mcg Oral QAC breakfast  . metoprolol tartrate  25 mg Oral BID  . pantoprazole  40 mg Oral Daily  . risperiDONE  0.5 mg Oral BID  . simvastatin  40 mg Oral Daily  . tamsulosin  0.4 mg Oral QPC breakfast   Continuous Infusions: . ceFEPime (MAXIPIME) IV    . [START ON 09/24/2017] vancomycin       LOS: 10 days    Time spent: 30 minutes.     Hosie Poisson, MD [facility] Pager [phone]  If 7PM-7AM, please contact night-coverage [email or web address] Password [email or web address] 09/23/2017, 1:28 PM

## 2017-09-23 NOTE — Progress Notes (Signed)
Pt alert and talking most of the day, able to feed self, eating finger snacks. Pt denies pain and states feel better today. O2 off RA sat 95%.  VSs,  will cont to monitor. SRP, RN

## 2017-09-24 LAB — CREATININE, SERUM
Creatinine, Ser: 1.66 mg/dL — ABNORMAL HIGH (ref 0.44–1.00)
GFR calc non Af Amer: 25 mL/min — ABNORMAL LOW (ref >60)
GFR, EST AFRICAN AMERICAN: 29 mL/min — AB (ref >60)

## 2017-09-24 LAB — URINE CULTURE

## 2017-09-24 MED ORDER — TRAZODONE HCL 50 MG PO TABS
50.0000 mg | ORAL_TABLET | Freq: Once | ORAL | Status: AC
  Administered 2017-09-24: 50 mg via ORAL
  Filled 2017-09-24: qty 1

## 2017-09-24 MED ORDER — PREDNISONE 20 MG PO TABS
40.0000 mg | ORAL_TABLET | Freq: Every day | ORAL | Status: DC
  Administered 2017-09-25 – 2017-09-26 (×2): 40 mg via ORAL
  Filled 2017-09-24 (×2): qty 2

## 2017-09-24 NOTE — Progress Notes (Signed)
PROGRESS NOTE    Shirley [name]  OHY:[id number] DOB: 1922/03/04 DOA: 09/13/2017 PCP: Jani Gravel, MD  Brief Narrative:  Shirley [name] 84 year old female with medical history significant for hypertension, hypothyroidism and her murmur presented to the emergency department after being seen by PCP for generalized weakness.  She was referred to the ER after was found with hemoglobin of 5.5.  Patient very poor recent black tarry stool.  Never had endoscopy or colonoscopy.  Denies NSAIDs use.  Upon ED evaluation hemoglobin 5.5, hypotensive and tachycardic.  Patient was admitted with working diagnosis of GI bleed.  Gastroenterology was consulted and patient was started on Protonix drip and transfuse.   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 . Transfuse to keep hemoglobin greater than 7.  Underwent a total of 4 units of prbc transfusion.  If she has further bleeding, plan for colonoscopy vs capsule endoscopy.  Hemoglobin is currently stable at 10. No active issues.    Severe AS: Cardiology consulted, not a candidate for TAVR. Palliative care consulted in view of the multiple medical problems.  Patient's son would like to continue with aggressive management at this time . pts son agreeable to SNF on discharge.   Acute diastolic heart failure due to TACO (transfusion associated circulatory overload) Fluid overloaded with urinary retention. S/p foley catheter.  Strict intake and output.  Daily weights.  Started on IV lasix 20 mg BID, transitioned to oral lasix . Recheck creatinine in am.  Resume metoprolol 25 mg bID.    PAF: In NSR resume metoprolol twice daily.  Not a candidate for ANTI COAGULATION given GI bleed.     Hypertension: well controlled.    Hypothyroidism:  Resume synthroid.    Acute on stage 3 CKD:  - improved.  Stabilized at 1.3  Acute respiratory failure with hypoxia secondary to possible COPD exacerbation. Patient does not have a history of COPD, but she has a history of smoking in the past and she has exposure to secondhand smoking.  Is on duo nebs every 6 hours scheduled.  On prednisone and will do a quick taper over the next few days.  Chest x-ray showed bibasilar airspace disease we will start her on broad-spectrum IV antibiotics and monitor. Pt looks much better, transitioned off oxygen and on exam wheezing is almost resolved.     DVT prophylaxis: scd's/  Code Status: DNR. Family Communication: son at bedside.  disposition Plan: SNF on discharge.   Consultants:   Palliative care  Cardiology.  Procedures: none.      Antimicrobials: none.    Subjective: Good spirits. No chest pain, or sob.  Objective: Vitals:   09/24/17 0426 09/24/17 0828 09/24/17 1419 09/24/17 1430  BP: 121/78   126/73  Pulse: 84   71  Resp: 18   20  Temp: 98.1 F (36.7 C)   97.9 F (36.6 C)  TempSrc:    Oral  SpO2: 96% 93% 98% 98%  Weight:      Height:        Intake/Output Summary (Last 24 hours) at 09/24/2017 1746 Last data filed at 09/24/2017 1314 Gross per 24 hour  Intake 900 ml  Output 1400 ml  Net -500 ml   Danley Danker  Weights   09/22/17 0639 09/23/17 0506 09/24/17 0423  Weight: 72.1 kg (158 lb 15.2 oz) 70.1 kg (154 lb 8.7 oz) 70.2 kg (154 lb 12.2 oz)    Examination:  General exam: Appears calm and comfortable, not in distress. On RA.  Respiratory system: wheezing improved air entry fair SCATTERED CRACKLES.  Cardiovascular system: S1 & S2 heard, RRR. Murmer present.no JVD.   Gastrointestinal system: Abdomen  Is soft non tender non distended bowel sounds good.  Central nervous system: Alert and able to answer simple questions.  Extremities: no pedal edema,  Skin: No  rashes, lesions or ulcers Psychiatry:  Mood & affect appropriate.     Data Reviewed: I have personally reviewed following labs and imaging studies  CBC: Recent Labs  Lab 09/18/17 0813 09/20/17 0412 09/22/17 1418  WBC 5.8 5.1 9.6  NEUTROABS 4.5 3.9 8.0*  HGB 10.8* 10.3* 10.9*  HCT 34.4* 32.2* 34.2*  MCV 93.0 92.0 92.2  PLT 198 228 941   Basic Metabolic Panel: Recent Labs  Lab 09/18/17 0813 09/19/17 0504 09/20/17 0412 09/21/17 0412 09/23/17 0402 09/24/17 0422  NA 139 139 137 137  --   --   K 3.6 4.1 3.8 3.7  --   --   CL 104 105 103 101  --   --   CO2 23 25 23 25   --   --   GLUCOSE 132* 129* 112* 109*  --   --   BUN 30* 35* 34* 39*  --   --   CREATININE 1.54* 1.44* 1.41* 1.49* 1.39* 1.66*  CALCIUM 8.3* 8.5* 8.0* 8.3*  --   --   MG  --   --  1.8  --   --   --    GFR: Estimated Creatinine Clearance: 19.9 mL/min (A) (by C-G formula based on SCr of 1.66 mg/dL (H)). Liver Function Tests: No results for input(s): AST, ALT, ALKPHOS, BILITOT, PROT, ALBUMIN in the last 168 hours. No results for input(s): LIPASE, AMYLASE in the last 168 hours. No results for input(s): AMMONIA in the last 168 hours. Coagulation Profile: No results for input(s): INR, PROTIME in the last 168 hours. Cardiac Enzymes: No results for input(s): CKTOTAL, CKMB, CKMBINDEX, TROPONINI in the last 168 hours. BNP (last 3 results) No results for input(s): PROBNP in the last 8760 hours. HbA1C: No results for input(s): HGBA1C in the last 72 hours. CBG: No results for input(s): GLUCAP in the last 168 hours. Lipid Profile: No results for input(s): CHOL, HDL, LDLCALC, TRIG, CHOLHDL, LDLDIRECT in the last 72 hours. Thyroid Function Tests: No results for input(s): TSH, T4TOTAL, FREET4, T3FREE, THYROIDAB in the last 72 hours. Anemia Panel: No results for input(s): VITAMINB12, FOLATE, FERRITIN, TIBC, IRON, RETICCTPCT in the last 72 hours. Sepsis Labs: No results for input(s): PROCALCITON, LATICACIDVEN in the  last 168 hours.  Recent Results (from the past 240 hour(s))  MRSA PCR Screening     Status: None   Collection Time: 09/22/17  1:50 PM  Result Value Ref Range Status   MRSA by PCR NEGATIVE NEGATIVE Final    Comment:        The GeneXpert MRSA Assay (FDA approved for NASAL specimens only), is one component of a comprehensive MRSA colonization surveillance program. It is not intended to diagnose MRSA infection nor to guide or monitor treatment for MRSA infections. Performed at [facility], [place] [address], [place], [place] [address]   Culture, Urine     Status: Abnormal   Collection  Time: 09/22/17  3:38 PM  Result Value Ref Range Status   Specimen Description   Final    URINE, CATHETERIZED Performed at [facility] [address], [place], [place] [address]    Special Requests   Final    NONE Performed at [facility], [place] [address], [place], [place] [address]    Culture 40,000 COLONIES/mL ESCHERICHIA COLI (A)  Final   Report Status 09/24/2017 FINAL  Final   Organism ID, Bacteria ESCHERICHIA COLI (A)  Final      Susceptibility   Escherichia coli - MIC*    AMPICILLIN <=2 SENSITIVE Sensitive     CEFAZOLIN <=4 SENSITIVE Sensitive     CEFTRIAXONE <=1 SENSITIVE Sensitive     CIPROFLOXACIN <=0.25 SENSITIVE Sensitive     GENTAMICIN <=1 SENSITIVE Sensitive     IMIPENEM <=0.25 SENSITIVE Sensitive     NITROFURANTOIN 32 SENSITIVE Sensitive     TRIMETH/SULFA <=20 SENSITIVE Sensitive     AMPICILLIN/SULBACTAM <=2 SENSITIVE Sensitive     PIP/TAZO <=4 SENSITIVE Sensitive     Extended ESBL NEGATIVE Sensitive     * 40,000 COLONIES/mL ESCHERICHIA COLI         Radiology Studies: No results found.      Scheduled Meds: . feeding supplement (ENSURE ENLIVE)  237 mL Oral TID BM  . ferrous sulfate  325 mg Oral QPM  . furosemide  20 mg Oral BID  . ipratropium-albuterol  3 mL Nebulization TID  . levothyroxine  50 mcg Oral QAC  breakfast  . metoprolol tartrate  25 mg Oral BID  . pantoprazole  40 mg Oral Daily  . predniSONE  60 mg Oral QAC breakfast  . risperiDONE  0.5 mg Oral BID  . simvastatin  40 mg Oral Daily  . tamsulosin  0.4 mg Oral QPC breakfast   Continuous Infusions: . ceFEPime (MAXIPIME) IV Stopped (09/24/17 1344)  . vancomycin 750 mg (09/24/17 1705)     LOS: 11 days    Time spent: 30 minutes.     Hosie Poisson, MD [facility] Pager [phone]  If 7PM-7AM, please contact night-coverage [email or web address] Password [facility] 09/24/2017, 5:46 PM

## 2017-09-25 ENCOUNTER — Inpatient Hospital Stay (HOSPITAL_COMMUNITY): Payer: Medicare Other

## 2017-09-25 LAB — BASIC METABOLIC PANEL
Anion gap: 10 (ref 5–15)
BUN: 55 mg/dL — AB (ref 6–20)
CHLORIDE: 101 mmol/L (ref 101–111)
CO2: 23 mmol/L (ref 22–32)
CREATININE: 1.45 mg/dL — AB (ref 0.44–1.00)
Calcium: 8.2 mg/dL — ABNORMAL LOW (ref 8.9–10.3)
GFR calc Af Amer: 35 mL/min — ABNORMAL LOW (ref >60)
GFR calc non Af Amer: 30 mL/min — ABNORMAL LOW (ref >60)
GLUCOSE: 203 mg/dL — AB (ref 65–99)
Potassium: 3.9 mmol/L (ref 3.5–5.1)
SODIUM: 134 mmol/L — AB (ref 135–145)

## 2017-09-25 LAB — CBC
HCT: 31.1 % — ABNORMAL LOW (ref 36.0–46.0)
Hemoglobin: 10.1 g/dL — ABNORMAL LOW (ref 12.0–15.0)
MCH: 29.4 pg (ref 26.0–34.0)
MCHC: 32.5 g/dL (ref 30.0–36.0)
MCV: 90.4 fL (ref 78.0–100.0)
Platelets: 237 10*3/uL (ref 150–400)
RBC: 3.44 MIL/uL — ABNORMAL LOW (ref 3.87–5.11)
RDW: 15.6 % — ABNORMAL HIGH (ref 11.5–15.5)
WBC: 6.5 10*3/uL (ref 4.0–10.5)

## 2017-09-25 LAB — CREATININE, SERUM
CREATININE: 1.54 mg/dL — AB (ref 0.44–1.00)
GFR calc Af Amer: 32 mL/min — ABNORMAL LOW (ref >60)
GFR, EST NON AFRICAN AMERICAN: 28 mL/min — AB (ref >60)

## 2017-09-25 MED ORDER — METOPROLOL TARTRATE 25 MG PO TABS: 25.0000 mg | ORAL_TABLET | Freq: Two times a day (BID) | ORAL | 0 refills | Status: AC

## 2017-09-25 MED ORDER — FUROSEMIDE 10 MG/ML IJ SOLN
20.0000 mg | Freq: Once | INTRAMUSCULAR | Status: AC
  Administered 2017-09-25: 20 mg via INTRAVENOUS
  Filled 2017-09-25: qty 2

## 2017-09-25 MED ORDER — HYDROCORTISONE 1 % EX CREA: TOPICAL_CREAM | CUTANEOUS | 0 refills | Status: AC | PRN

## 2017-09-25 MED ORDER — AMOXICILLIN-POT CLAVULANATE 500-125 MG PO TABS
1.0000 | ORAL_TABLET | Freq: Two times a day (BID) | ORAL | 0 refills | Status: DC
Start: 2017-09-26 — End: 2017-09-26

## 2017-09-25 MED ORDER — PANTOPRAZOLE SODIUM 40 MG PO TBEC: 40.0000 mg | DELAYED_RELEASE_TABLET | Freq: Every day | ORAL | 0 refills | Status: AC

## 2017-09-25 MED ORDER — SODIUM CHLORIDE 0.9 % IV BOLUS
500.0000 mL | Freq: Once | INTRAVENOUS | Status: AC
  Administered 2017-09-25: 500 mL via INTRAVENOUS

## 2017-09-25 MED ORDER — ENSURE ENLIVE PO LIQD: 237.0000 mL | Freq: Three times a day (TID) | ORAL | 12 refills | Status: AC

## 2017-09-25 MED ORDER — TAMSULOSIN HCL 0.4 MG PO CAPS: 0.4000 mg | ORAL_CAPSULE | Freq: Every day | ORAL | 0 refills | Status: AC

## 2017-09-25 MED ORDER — IPRATROPIUM-ALBUTEROL 0.5-2.5 (3) MG/3ML IN SOLN: 3.0000 mL | Freq: Four times a day (QID) | RESPIRATORY_TRACT | 2 refills | Status: AC | PRN

## 2017-09-25 MED ORDER — ALBUTEROL SULFATE (2.5 MG/3ML) 0.083% IN NEBU: 2.5000 mg | INHALATION_SOLUTION | RESPIRATORY_TRACT | 12 refills | Status: AC | PRN

## 2017-09-25 MED ORDER — RISPERIDONE 0.5 MG PO TABS: 0.5000 mg | ORAL_TABLET | Freq: Two times a day (BID) | ORAL | 1 refills | Status: AC

## 2017-09-25 MED ORDER — PREDNISONE 20 MG PO TABS: 20.0000 mg | ORAL_TABLET | Freq: Every day | ORAL | 0 refills | Status: AC

## 2017-09-25 MED ORDER — IPRATROPIUM-ALBUTEROL 0.5-2.5 (3) MG/3ML IN SOLN
3.0000 mL | Freq: Once | RESPIRATORY_TRACT | Status: AC
  Administered 2017-09-25: 3 mL via RESPIRATORY_TRACT
  Filled 2017-09-25: qty 3

## 2017-09-25 MED ORDER — AMOXICILLIN-POT CLAVULANATE 500-125 MG PO TABS
1.0000 | ORAL_TABLET | Freq: Two times a day (BID) | ORAL | Status: DC
  Administered 2017-09-25 – 2017-09-26 (×3): 500 mg via ORAL
  Filled 2017-09-25 (×4): qty 1

## 2017-09-25 NOTE — Discharge Summary (Addendum)
Physician Discharge Summary  Shirley [name] [place] FAO:[id number] DOB: Oct 21, 1939 DOA: 09/13/2017  PCP: Jani Gravel, MD  Admit date: 09/13/2017 Discharge date: 09/26/2017  Admitted From: Home Disposition:  [facility]  Recommendations for Outpatient Follow-up:  1. Follow up with PCP in 1-2 weeks 2. Please obtain BMP/CBC in one week 3. Please follow up with cardiology in 2 to 3 weeks.  4. Please follow up with urology as recommended in one week. An appointment will be scheduled once they receive the paperwork.     Discharge Condition:Stable.  CODE STATUS:DNR Diet recommendation: Heart Healthy   Brief/Interim Summary: Shirley Ann Haferbecker84 year old female with medical history significant for hypertension, hypothyroidism and her murmur presented to the emergency department after being seen by PCP for generalized weakness. She was referred to the ER after was found with hemoglobin of 5.5. Patient very poor recent black tarry stool. Never had endoscopy or colonoscopy. Denies NSAIDs use. Upon ED evaluation hemoglobin 5.5, hypotensive and tachycardic. Patient was admitted with working diagnosis of GI bleed. Gastroenterology was consulted and patient was started on Protonix drip. Her H&H remains stable but she developed fluid overload from acute on chronic diastolic CHF and health care associated pneumonia. She was appropriately diuresed and treated with IV antibiotics. She will be discharged on oral antibiotics to complete the course.     Discharge Diagnoses:  Principal Problem:   GI bleed Active Problems:   Aortic stenosis, severe   Acute diastolic CHF (congestive heart failure) (HCC)   Atrial fibrillation with rapid ventricular response (HCC)   ACute blood loss anemia sec to upper GI bleed: S/p EGD by GI, showing AVM'S, s/p APC. . Transfuse to keep hemoglobin greater than 7.  Underwent a total of 4 units of prbc transfusion.  If she has further bleeding, plan for  colonoscopy vs capsule endoscopy.  Hemoglobin is currently stable at 10. No active issues.    Severe AS: Cardiology consulted, not a candidate for TAVR. Palliative care consulted in view of the multiple medical problems.  Patient's son would like to continue with aggressive management at this time . pts son agreeable to SNF on discharge.   Acute diastolic heart failure due toTACO (transfusion associated circulatory overload) Fluid overloaded with urinary retention. S/p foley catheter.  Strict intake and output.  Daily weights.  Started on IV lasix 20 mg BID, transitioned to oral lasix back to 60 mg daily. Resume metoprolol 25 mg bID.    PAF: In NSR resume metoprolol twice daily.  Not a candidate for ANTI COAGULATION given GI bleed.    Hypertension: well controlled.    Hypothyroidism:  Resume synthroid.    Acute on stage 3 CKD:  - improved.  Stabilized at 1.3  Acute respiratory failure with hypoxia secondary to possible COPD exacerbation. Patient does not have a history of COPD, but she has a history of smoking in the past and she has exposure to secondhand smoking.  Is on duo nebs and albuterol.  On prednisone and will do a quick taper over the next few days.  Chest x-ray showed bibasilar airspace disease and we have started her on IV vancomycin and IV cefepime, and as her breathing improved, she was  transitioned off oxygen and on exam wheezing is almost resolved.  She will be discharged on oral antibiotics to complete the course. Stop date for antibiotic is 09/30/2017.   Urinary Retention: She underwent foley catheter placement. Family would like her to keep the foley and follow up with urology as voiding  trial.      Discharge Instructions  Discharge Instructions    Diet - low sodium heart healthy   Complete by:  As directed    Discharge instructions   Complete by:  As directed    Follow up with PCP in one week.     Allergies as of 09/26/2017   No  Known Allergies     Medication List    STOP taking these medications   carvedilol 12.5 MG tablet Commonly known as:  COREG   cephALEXin 500 MG capsule Commonly known as:  KEFLEX   lisinopril 20 MG tablet Commonly known as:  PRINIVIL,ZESTRIL   mupirocin ointment 2 % Commonly known as:  BACTROBAN   omeprazole 20 MG capsule Commonly known as:  PRILOSEC   ondansetron 4 MG tablet Commonly known as:  ZOFRAN   traMADol 50 MG tablet Commonly known as:  ULTRAM     TAKE these medications   acetaminophen 325 MG tablet Commonly known as:  TYLENOL Take 650 mg by mouth every 6 (six) hours as needed for moderate pain.   albuterol (2.5 MG/3ML) 0.083% nebulizer solution Commonly known as:  PROVENTIL Take 3 mLs (2.5 mg total) by nebulization every 4 (four) hours as needed for wheezing or shortness of breath.   allopurinol 300 MG tablet Commonly known as:  ZYLOPRIM Take 300 mg by mouth daily.   amoxicillin-clavulanate 500-125 MG tablet Commonly known as:  AUGMENTIN Take 1 tablet (500 mg total) by mouth 2 (two) times daily for 4 days.   cholecalciferol 1000 units tablet Commonly known as:  VITAMIN D Take 1,000 Units by mouth daily.   docusate sodium 250 MG capsule Commonly known as:  COLACE Take 250 mg by mouth 2 (two) times daily.   feeding supplement (ENSURE ENLIVE) Liqd Take 237 mLs by mouth 3 (three) times daily between meals.   ferrous sulfate 325 (65 FE) MG EC tablet Take 325 mg by mouth every evening.   furosemide 40 MG tablet Commonly known as:  LASIX Take 60 mg by mouth daily.   hydrocortisone cream 1 % Apply topically as needed for itching.   ipratropium-albuterol 0.5-2.5 (3) MG/3ML Soln Commonly known as:  DUONEB Take 3 mLs by nebulization every 6 (six) hours as needed.   levothyroxine 50 MCG tablet Commonly known as:  SYNTHROID, LEVOTHROID Take 50 mcg by mouth daily.   meclizine 25 MG tablet Commonly known as:  ANTIVERT Take 25 mg by mouth 3 (three)  times daily as needed for dizziness.   metoprolol tartrate 25 MG tablet Commonly known as:  LOPRESSOR Take 1 tablet (25 mg total) by mouth 2 (two) times daily.   pantoprazole 40 MG tablet Commonly known as:  PROTONIX Take 1 tablet (40 mg total) by mouth daily.   polyethylene glycol packet Commonly known as:  MIRALAX / GLYCOLAX Take 17 g by mouth daily as needed for moderate constipation.   predniSONE 20 MG tablet Commonly known as:  DELTASONE Take 1 tablet (20 mg total) by mouth daily with breakfast for 2 days. Start taking on:  09/27/2017   risperiDONE 0.5 MG tablet Commonly known as:  RISPERDAL Take 1 tablet (0.5 mg total) by mouth 2 (two) times daily.   simvastatin 40 MG tablet Commonly known as:  ZOCOR Take 40 mg by mouth daily.   tamsulosin 0.4 MG Caps capsule Commonly known as:  FLOMAX Take 1 capsule (0.4 mg total) by mouth daily after breakfast.       Contact information for follow-up  providers    Sherren Mocha, MD Follow up on 10/12/2017.   Specialty:  Cardiology Why:  at 9 AM for eval of your heart valve murmur and possiblity to improve it with procedure.  Contact information: [address] [address] [phone]            Contact information for after-discharge care    Destination    [facility] .   Service:  Skilled Nursing Contact information: [address] [place] [phone]                 No Known Allergies  Consultations:  Palliative care  cardiology   Procedures/Studies: Dg Chest 2 View  Result Date: 09/25/2017 CLINICAL DATA:  Low blood pressure last night.  Cough for 1 week. EXAM: CHEST - 2 VIEW COMPARISON:  September 22, 2017 FINDINGS: No pneumothorax. Stable cardiomegaly. The hila and mediastinum are unchanged. Persistent opacities in the bases, mildly more prominent the interval. Atelectasis in the left mid lung. Small effusions. Wedging of an upper lumbar  vertebral body. This is stable. IMPRESSION: Increasing small effusions and underlying opacities. Mild pulmonary venous congestion cough, also more prominent. Electronically Signed   By: Dorise Bullion III M.D   On: 09/25/2017 17:43   Ct Abdomen Pelvis W Contrast  Result Date: 09/15/2017 CLINICAL DATA:  84 year old female with a history of generalized weakness and a hemoglobin of 5.5 EXAM: CT ABDOMEN AND PELVIS WITH CONTRAST TECHNIQUE: Multidetector CT imaging of the abdomen and pelvis was performed using the standard protocol following bolus administration of intravenous contrast. CONTRAST:  14mL OMNIPAQUE IOHEXOL 300 MG/ML SOLN, 30mL ISOVUE-300 IOPAMIDOL (ISOVUE-300) INJECTION 61% COMPARISON:  CT 05/05/2017 FINDINGS: Lower chest: Bilateral pleural effusions. Mixed ground-glass nodular opacity at the bilateral lung bases, most likely atelectasis/scarring. Mitral annular calcifications. Hepatobiliary: Unremarkable appearance of the liver. Minimal hyperdense material at the dependent aspect of the gallbladder, compatible with cholelithiasis. No inflammatory changes. Pancreas: Unremarkable pancreas Spleen: Unremarkable spleen Adrenals/Urinary Tract: Unremarkable bilateral adrenal glands. Right kidney without hydronephrosis. Calcification at the posterior cortex appears to represent vascular calcification. No definite evidence of nephrolithiasis. Unremarkable course of the right ureter. Left kidney unremarkable with no hydronephrosis or nephrolithiasis. Unremarkable course of the left ureter. Unremarkable appearance of the urinary bladder. Stomach/Bowel: Hiatal hernia. Unremarkable stomach. Unremarkable small bowel. No abnormal distention. No focal wall thickening. No associated inflammatory changes. No transition point. Appendix is not visualized, however, no inflammatory changes are present adjacent to the cecum to indicate an appendicitis. Colon is decompressed with no focal wall thickening. No inflammatory  changes. Minimal diverticular disease. No associated inflammatory changes. Vascular/Lymphatic: Calcifications of the abdominal aorta. Calcifications involve the mesenteric vessel origins and the bilateral renal artery origins, better characterized on recent CT angiogram. Mesenteric arteries and bilateral renal arteries appear patent. Bilateral iliac arteries and proximal femoral arteries are patent. Reproductive: Unremarkable uterus and adnexa. Other: No abdominal wall hernia. Minimal abdominal wall stranding/infiltration. Musculoskeletal: No acute displaced fracture. Advanced degenerative changes of the visualized thoracolumbar spine. Vacuum disc phenomenon present at T12-L1, L1-L2, L3-L4, L4-L5. Posterior disc bulge at L2-L3, L3-L4 with no bony canal narrowing. Advanced disc space narrowing at L1-L2 with sclerotic endplate changes. Degenerative changes of the bilateral hips. IMPRESSION: No acute CT finding. Bilateral pleural effusions with associated atelectasis. Aortic Atherosclerosis (ICD10-I70.0). Associated atherosclerotic changes of the mesenteric arteries and renal arteries. Degenerative disease of the lumbar spine. Mild diverticular disease without evidence of acute diverticulitis. Cholelithiasis without evidence of acute  cholecystitis. Electronically Signed   By: Corrie Mckusick D.O.   On: 09/15/2017 17:09   Dg Chest Port 1 View  Result Date: 09/22/2017 CLINICAL DATA:  Shortness of breath, history of CHF and atrial fibrillation EXAM: PORTABLE CHEST 1 VIEW COMPARISON:  Portable chest x-ray of 09/18/2017 FINDINGS: There is little change in airspace disease at both lung bases. This appears more than would be expected with just atelectasis, and bibasilar pneumonia is a consideration. Small pleural effusions cannot be excluded. Mild cardiomegaly is stable. Moderate changes of thoracic aortic atherosclerosis are noted. Significant degenerative changes present within the left shoulder. IMPRESSION: 1. Persistent  bibasilar airspace disease suspicious for pneumonia. Small effusions and mild atelectasis may also be present. 2. Stable cardiomegaly. 3. Significant degenerative change in the left shoulder. Electronically Signed   By: Ivar Drape M.D.   On: 09/22/2017 11:22   Dg Chest Port 1 View  Result Date: 09/18/2017 CLINICAL DATA:  Shortness of breath. EXAM: PORTABLE CHEST 1 VIEW COMPARISON:  Radiograph of September 17, 2017. FINDINGS: Stable cardiomediastinal silhouette. Atherosclerosis of thoracic aorta is noted. No pneumothorax is noted. Mild bibasilar subsegmental atelectasis and pleural effusions are noted. Bony thorax is unremarkable. IMPRESSION: Mild bibasilar subsegmental atelectasis and pleural effusions. Aortic Atherosclerosis (ICD10-I70.0). Electronically Signed   By: Marijo Conception, M.D.   On: 09/18/2017 13:00   Dg Chest Port 1 View  Result Date: 09/17/2017 CLINICAL DATA:  Shortness of breath today. EXAM: PORTABLE CHEST 1 VIEW COMPARISON:  PA and lateral chest and CT chest 05/05/2017. FINDINGS: There is right worse than left basilar airspace disease with associated small to moderate effusions. Heart size is upper normal. Aortic atherosclerosis noted. The visualized skeletal structures are unremarkable. IMPRESSION: Right greater than left airspace disease which could be atelectasis or pneumonia with associated small to moderate effusions. Electronically Signed   By: Inge Rise M.D.   On: 09/17/2017 10:50       Subjective:   Discharge Exam: Vitals:   09/26/17 0758 09/26/17 0945  BP:  (!) 139/47  Pulse: 72 81  Resp: 18 20  Temp:  98 F (36.7 C)  SpO2: 97% 98%   Vitals:   09/26/17 0500 09/26/17 0505 09/26/17 0758 09/26/17 0945  BP:  121/84  (!) 139/47  Pulse:  66 72 81  Resp:  20 18 20   Temp:  97.6 F (36.4 C)  98 F (36.7 C)  TempSrc:  Oral  Oral  SpO2:  96% 97% 98%  Weight: 75.8 kg (167 lb 1.7 oz)     Height:        General: Pt is alert, awake, not in acute  distress Cardiovascular: RRR, S1/S2 +, no rubs, no gallops Respiratory: CTA bilaterally, no wheezing, no rhonchi Abdominal: Soft, NT, ND, bowel sounds + Extremities: no edema, no cyanosis    The results of significant diagnostics from this hospitalization (including imaging, microbiology, ancillary and laboratory) are listed below for reference.     Microbiology: Recent Results (from the past 240 hour(s))  MRSA PCR Screening     Status: None   Collection Time: 09/22/17  1:50 PM  Result Value Ref Range Status   MRSA by PCR NEGATIVE NEGATIVE Final    Comment:        The GeneXpert MRSA Assay (FDA approved for NASAL specimens only), is one component of a comprehensive MRSA colonization surveillance program. It is not intended to diagnose MRSA infection nor to guide or monitor treatment for MRSA infections. Performed at [facility], [place] [address] [place], [place] [address]   Culture, Urine     Status: Abnormal   Collection Time: 09/22/17  3:38 PM  Result Value Ref Range Status   Specimen Description   Final    URINE, CATHETERIZED Performed at [facility] [address], [place], [place] [address]    Special Requests   Final    NONE Performed at [facility], [place] [address] [place], [place] [address]    Culture 40,000 COLONIES/mL ESCHERICHIA COLI (A)  Final   Report Status 09/24/2017 FINAL  Final   Organism ID, Bacteria ESCHERICHIA COLI (A)  Final      Susceptibility   Escherichia coli - MIC*    AMPICILLIN <=2 SENSITIVE Sensitive     CEFAZOLIN <=4 SENSITIVE Sensitive     CEFTRIAXONE <=1 SENSITIVE Sensitive     CIPROFLOXACIN <=0.25 SENSITIVE Sensitive     GENTAMICIN <=1 SENSITIVE Sensitive     IMIPENEM <=0.25 SENSITIVE Sensitive     NITROFURANTOIN 32 SENSITIVE Sensitive     TRIMETH/SULFA <=20 SENSITIVE Sensitive     AMPICILLIN/SULBACTAM <=2 SENSITIVE Sensitive     PIP/TAZO <=4 SENSITIVE Sensitive      Extended ESBL NEGATIVE Sensitive     * 40,000 COLONIES/mL ESCHERICHIA COLI     Labs: BNP (last 3 results) Recent Labs    09/17/17 1200  BNP 6,433.2*   Basic Metabolic Panel: Recent Labs  Lab 09/20/17 0412 09/21/17 0412 09/23/17 0402 09/24/17 0422 09/25/17 0132 09/26/17 0447  NA 137 137  --   --  134*  --   K 3.8 3.7  --   --  3.9  --   CL 103 101  --   --  101  --   CO2 23 25  --   --  23  --   GLUCOSE 112* 109*  --   --  203*  --   BUN 34* 39*  --   --  55*  --   CREATININE 1.41* 1.49* 1.39* 1.66* 1.45*  1.54* 1.38*  CALCIUM 8.0* 8.3*  --   --  8.2*  --   MG 1.8  --   --   --   --   --    Liver Function Tests: No results for input(s): AST, ALT, ALKPHOS, BILITOT, PROT, ALBUMIN in the last 168 hours. No results for input(s): LIPASE, AMYLASE in the last 168 hours. No results for input(s): AMMONIA in the last 168 hours. CBC: Recent Labs  Lab 09/20/17 0412 09/22/17 1418 09/25/17 0132  WBC 5.1 9.6 6.5  NEUTROABS 3.9 8.0*  --   HGB 10.3* 10.9* 10.1*  HCT 32.2* 34.2* 31.1*  MCV 92.0 92.2 90.4  PLT 228 218 237   Cardiac Enzymes: No results for input(s): CKTOTAL, CKMB, CKMBINDEX, TROPONINI in the last 168 hours. BNP: Invalid input(s): POCBNP CBG: No results for input(s): GLUCAP in the last 168 hours. D-Dimer No results for input(s): DDIMER in the last 72 hours. Hgb A1c No results for input(s): HGBA1C in the last 72 hours. Lipid Profile No results for input(s): CHOL, HDL, LDLCALC, TRIG, CHOLHDL, LDLDIRECT in the last 72 hours. Thyroid function studies No results for input(s): TSH, T4TOTAL, T3FREE, THYROIDAB in the last 72 hours.  Invalid input(s): FREET3 Anemia work up No results for input(s): VITAMINB12, FOLATE, FERRITIN, TIBC, IRON, RETICCTPCT in the last 72 hours. Urinalysis    Component Value Date/Time   COLORURINE YELLOW 09/22/2017 1538   APPEARANCEUR HAZY (A) 09/22/2017 1538  LABSPEC 1.014 09/22/2017 1538   PHURINE 5.0 09/22/2017 1538   GLUCOSEU  NEGATIVE 09/22/2017 1538   HGBUR MODERATE (A) 09/22/2017 1538   BILIRUBINUR NEGATIVE 09/22/2017 1538   KETONESUR NEGATIVE 09/22/2017 1538   PROTEINUR NEGATIVE 09/22/2017 1538   NITRITE NEGATIVE 09/22/2017 1538   LEUKOCYTESUR MODERATE (A) 09/22/2017 1538   Sepsis Labs Invalid input(s): PROCALCITONIN,  WBC,  LACTICIDVEN Microbiology Recent Results (from the past 240 hour(s))  MRSA PCR Screening     Status: None   Collection Time: 09/22/17  1:50 PM  Result Value Ref Range Status   MRSA by PCR NEGATIVE NEGATIVE Final    Comment:        The GeneXpert MRSA Assay (FDA approved for NASAL specimens only), is one component of a comprehensive MRSA colonization surveillance program. It is not intended to diagnose MRSA infection nor to guide or monitor treatment for MRSA infections. Performed at [facility], [place] [address], [place], [place] [address]   Culture, Urine     Status: Abnormal   Collection Time: 09/22/17  3:38 PM  Result Value Ref Range Status   Specimen Description   Final    URINE, CATHETERIZED Performed at [facility] [address] [place], [place] [address]    Special Requests   Final    NONE Performed at [facility], [place] [address], [place], [place] [address]    Culture 40,000 COLONIES/mL ESCHERICHIA COLI (A)  Final   Report Status 09/24/2017 FINAL  Final   Organism ID, Bacteria ESCHERICHIA COLI (A)  Final      Susceptibility   Escherichia coli - MIC*    AMPICILLIN <=2 SENSITIVE Sensitive     CEFAZOLIN <=4 SENSITIVE Sensitive     CEFTRIAXONE <=1 SENSITIVE Sensitive     CIPROFLOXACIN <=0.25 SENSITIVE Sensitive     GENTAMICIN <=1 SENSITIVE Sensitive     IMIPENEM <=0.25 SENSITIVE Sensitive     NITROFURANTOIN 32 SENSITIVE Sensitive     TRIMETH/SULFA <=20 SENSITIVE Sensitive     AMPICILLIN/SULBACTAM <=2 SENSITIVE Sensitive     PIP/TAZO <=4 SENSITIVE Sensitive     Extended ESBL NEGATIVE Sensitive     *  40,000 COLONIES/mL ESCHERICHIA COLI     Time coordinating discharge:  35 minutes  SIGNED:   Hosie Poisson, MD  [facility] 09/26/2017, 11:45 AM Pager   If 7PM-7AM, please contact night-coverage [email or web address] Password [email or web address]

## 2017-09-25 NOTE — Progress Notes (Addendum)
CCMD notified writer pt ran 12 beat run of vtach. Writer immediately checked on pt and took their vital signs. Pt's BP 83/53, pt asymptomatic. On call Bodenheimer notified and ordered a 569mL bolus and a STAT CBC. EKG completed. And Bodenheimer made aware it was completed.  Pt resting comfortably in bed. Denies SOB or pain. Will continue to monitor pt closely.

## 2017-09-25 NOTE — Progress Notes (Signed)
Pharmacy Antibiotic Note  Shirley [name] is a 84 y.o. female admitted on 09/13/2017 with anemia.  Pharmacy has been consulted for Vancomycin & Cefepime dosing.  Plan: Day 4 Antibiotics - Continue cefepime 1g IV q24 hr per current renal function; however, recommend narrow cefepime further to Rocephin or Unasyn for treatment of Ecoli UTI and possible AECOPD - Continue vancomycin 750mg  IV q48hr for now - goal AUC 400-500; however, MRSA PCR negative so recommend discontinue vancomycin  Height: 5\' 4"  (162.6 cm) Weight: 157 lb 6.5 oz (71.4 kg) IBW/kg (Calculated) : 54.7  Temp (24hrs), Avg:98 F (36.7 C), Min:97.7 F (36.5 C), Max:98.4 F (36.9 C)  Recent Labs  Lab 09/18/17 0813  09/20/17 0412 09/21/17 0412 09/22/17 1418 09/23/17 0402 09/24/17 0422 09/25/17 0132  WBC 5.8  --  5.1  --  9.6  --   --  6.5  CREATININE 1.54*   < > 1.41* 1.49*  --  1.39* 1.66* 1.54*   < > = values in this interval not displayed.    Estimated Creatinine Clearance: 21.7 mL/min (A) (by C-G formula based on SCr of 1.54 mg/dL (H)).    No Known Allergies  Antimicrobials this admission: 4/26 Cefepime >>  4/26 Vancomycin >>   Dose adjustments this admission:  Microbiology results: 4/26 UCx: 40k Ecoli - pansensitive  4/26 MRSA PCR: negative  Thank you for allowing pharmacy to be a part of this patient's care.  Adrian Saran, PharmD, BCPS Pager [phone] 09/25/2017 7:42 AM

## 2017-09-25 NOTE — Progress Notes (Signed)
Notified Dr. Silas [place] about pts BP being 94/63 P 67, pt asymptomatic. Pt suppose to be receiving metoprolol 25mg  at 22:00. New orders to hold metoprolol for tonight.

## 2017-09-25 NOTE — Progress Notes (Signed)
Pt/family have selected [facility] for ST rehab at [place]. (explain pt's granddaughter works there) Confirmed with admissions. Daughter in law at bedside states she plans to transport pt at DC (planning for DC tomorrow 09/26/17).  Sharren Bridge, MSW, LCSW Clinical Social Work 09/25/2017 [phone] coverage for [phone]

## 2017-09-25 NOTE — Care Management Important Message (Signed)
Important Message  Patient Details  Name: Shirley [name] MRN: [id number] Date of Birth: 07-May-1922   Medicare Important Message Given:  Yes    Kerin Salen 09/25/2017, 11:19 AMImportant Message  Patient Details  Name: Shirley [name] MRN: [id number] Date of Birth: [birth year]   Medicare Important Message Given:  Yes    Kerin Salen 09/25/2017, 11:19 AM

## 2017-09-26 DIAGNOSIS — N183 Chronic kidney disease, stage 3 (moderate): Secondary | ICD-10-CM | POA: Diagnosis not present

## 2017-09-26 DIAGNOSIS — K219 Gastro-esophageal reflux disease without esophagitis: Secondary | ICD-10-CM | POA: Diagnosis not present

## 2017-09-26 DIAGNOSIS — D509 Iron deficiency anemia, unspecified: Secondary | ICD-10-CM | POA: Diagnosis not present

## 2017-09-26 DIAGNOSIS — I5032 Chronic diastolic (congestive) heart failure: Secondary | ICD-10-CM | POA: Diagnosis not present

## 2017-09-26 DIAGNOSIS — F0281 Dementia in other diseases classified elsewhere with behavioral disturbance: Secondary | ICD-10-CM | POA: Diagnosis not present

## 2017-09-26 DIAGNOSIS — F4323 Adjustment disorder with mixed anxiety and depressed mood: Secondary | ICD-10-CM | POA: Diagnosis not present

## 2017-09-26 DIAGNOSIS — I10 Essential (primary) hypertension: Secondary | ICD-10-CM | POA: Diagnosis not present

## 2017-09-26 DIAGNOSIS — K922 Gastrointestinal hemorrhage, unspecified: Secondary | ICD-10-CM | POA: Diagnosis not present

## 2017-09-26 DIAGNOSIS — R278 Other lack of coordination: Secondary | ICD-10-CM | POA: Diagnosis not present

## 2017-09-26 DIAGNOSIS — R339 Retention of urine, unspecified: Secondary | ICD-10-CM | POA: Diagnosis not present

## 2017-09-26 DIAGNOSIS — R2681 Unsteadiness on feet: Secondary | ICD-10-CM | POA: Diagnosis not present

## 2017-09-26 DIAGNOSIS — K5900 Constipation, unspecified: Secondary | ICD-10-CM | POA: Diagnosis not present

## 2017-09-26 DIAGNOSIS — D649 Anemia, unspecified: Secondary | ICD-10-CM | POA: Diagnosis not present

## 2017-09-26 DIAGNOSIS — M1000 Idiopathic gout, unspecified site: Secondary | ICD-10-CM | POA: Diagnosis not present

## 2017-09-26 DIAGNOSIS — E039 Hypothyroidism, unspecified: Secondary | ICD-10-CM | POA: Diagnosis not present

## 2017-09-26 DIAGNOSIS — J9601 Acute respiratory failure with hypoxia: Secondary | ICD-10-CM | POA: Diagnosis not present

## 2017-09-26 DIAGNOSIS — J189 Pneumonia, unspecified organism: Secondary | ICD-10-CM | POA: Diagnosis not present

## 2017-09-26 DIAGNOSIS — R451 Restlessness and agitation: Secondary | ICD-10-CM | POA: Diagnosis not present

## 2017-09-26 DIAGNOSIS — G301 Alzheimer's disease with late onset: Secondary | ICD-10-CM | POA: Diagnosis not present

## 2017-09-26 DIAGNOSIS — I480 Paroxysmal atrial fibrillation: Secondary | ICD-10-CM | POA: Diagnosis not present

## 2017-09-26 DIAGNOSIS — Z9221 Personal history of antineoplastic chemotherapy: Secondary | ICD-10-CM | POA: Diagnosis not present

## 2017-09-26 DIAGNOSIS — F419 Anxiety disorder, unspecified: Secondary | ICD-10-CM | POA: Diagnosis not present

## 2017-09-26 DIAGNOSIS — E569 Vitamin deficiency, unspecified: Secondary | ICD-10-CM | POA: Diagnosis not present

## 2017-09-26 DIAGNOSIS — D500 Iron deficiency anemia secondary to blood loss (chronic): Secondary | ICD-10-CM | POA: Diagnosis not present

## 2017-09-26 DIAGNOSIS — R42 Dizziness and giddiness: Secondary | ICD-10-CM | POA: Diagnosis not present

## 2017-09-26 DIAGNOSIS — N139 Obstructive and reflux uropathy, unspecified: Secondary | ICD-10-CM | POA: Diagnosis not present

## 2017-09-26 DIAGNOSIS — F0391 Unspecified dementia with behavioral disturbance: Secondary | ICD-10-CM | POA: Diagnosis not present

## 2017-09-26 DIAGNOSIS — I350 Nonrheumatic aortic (valve) stenosis: Secondary | ICD-10-CM | POA: Diagnosis not present

## 2017-09-26 DIAGNOSIS — M6281 Muscle weakness (generalized): Secondary | ICD-10-CM | POA: Diagnosis not present

## 2017-09-26 DIAGNOSIS — I4891 Unspecified atrial fibrillation: Secondary | ICD-10-CM | POA: Diagnosis not present

## 2017-09-26 DIAGNOSIS — Z111 Encounter for screening for respiratory tuberculosis: Secondary | ICD-10-CM | POA: Diagnosis not present

## 2017-09-26 DIAGNOSIS — R600 Localized edema: Secondary | ICD-10-CM | POA: Diagnosis not present

## 2017-09-26 DIAGNOSIS — I509 Heart failure, unspecified: Secondary | ICD-10-CM | POA: Diagnosis not present

## 2017-09-26 DIAGNOSIS — R52 Pain, unspecified: Secondary | ICD-10-CM | POA: Diagnosis not present

## 2017-09-26 LAB — CREATININE, SERUM
CREATININE: 1.38 mg/dL — AB (ref 0.44–1.00)
GFR calc Af Amer: 37 mL/min — ABNORMAL LOW (ref >60)
GFR calc non Af Amer: 32 mL/min — ABNORMAL LOW (ref >60)

## 2017-09-26 MED ORDER — AMOXICILLIN-POT CLAVULANATE 500-125 MG PO TABS: 1.0000 | ORAL_TABLET | Freq: Two times a day (BID) | ORAL | 0 refills | Status: AC

## 2017-09-26 MED ORDER — AMOXICILLIN-POT CLAVULANATE 500-125 MG PO TABS: 1.0000 | ORAL_TABLET | Freq: Two times a day (BID) | ORAL | 0 refills | Status: DC

## 2017-09-26 NOTE — Progress Notes (Signed)
"  Report called to [facility] at [facility]. All questions answered. Pt and dtr-in-law aware of transfer and agreeable. Dtr-in-law in possession of transfer packet and all pt's personal belongings. Pt d/c in w/c in stable condtion to dtr-in-law car for transport to rehab.

## 2017-09-26 NOTE — Plan of Care (Signed)
  Problem: Health Behavior/Discharge Planning: Goal: Ability to manage health-related needs will improve Outcome: Adequate for Discharge   Problem: Clinical Measurements: Goal: Ability to maintain clinical measurements within normal limits will improve Outcome: Adequate for Discharge Goal: Will remain free from infection Outcome: Adequate for Discharge Goal: Respiratory complications will improve Outcome: Adequate for Discharge Goal: Cardiovascular complication will be avoided Outcome: Adequate for Discharge   

## 2017-09-26 NOTE — Clinical Social Work Placement (Signed)
Patient received and accepted bed offer at [facility]. Facility aware of patient's discharge and confirmed bed offer. Patient's daughter in law transporting patient to [facility]. Patient's RN provided with number to call report and packet. CSW signing off, no other needs identified at this time.  CLINICAL SOCIAL WORK PLACEMENT  NOTE  Date:  09/26/2017  Patient Details  Name: Shirley [name] MRN: [id number] Date of Birth: 02/23/1922  Clinical Social Work is seeking post-discharge placement for this patient at the [place] level of care (*CSW will initial, date and re-position this form in  chart as items are completed):  Yes   Patient/family provided with [place] Work Department's list of facilities offering this level of care within the geographic area requested by the patient (or if unable, by the patient's family).  Yes   Patient/family informed of their freedom to choose among providers that offer the needed level of care, that participate in Medicare, Medicaid or managed care program needed by the patient, have an available bed and are willing to accept the patient.  Yes   Patient/family informed of [place]'s ownership interest in [facility] and [facility], as well as of the fact that they are under no obligation to receive care at these facilities.  PASRR submitted to EDS on 09/22/17     PASRR number received on 09/22/17     Existing PASRR number confirmed on       FL2 transmitted to all facilities in geographic area requested by pt/family on 09/22/17     FL2 transmitted to all facilities within larger geographic area on       Patient informed that his/her managed care company has contracts with or will negotiate with certain facilities, including the following:        Yes   Patient/family informed of bed offers received.  Patient chooses bed at [facility]     Physician recommends and patient chooses bed at        Patient to be transferred to [facility] on 09/26/17.  Patient to be transferred to facility by Daughter in law Denzil Hughes)     Patient family notified on 09/26/17 of transfer.  Name of family member notified:  [facility]     PHYSICIAN       Additional Comment:    _______________________________________________ Burnis Medin, LCSW 09/26/2017, 11:51 AM

## 2017-10-04 DIAGNOSIS — D500 Iron deficiency anemia secondary to blood loss (chronic): Secondary | ICD-10-CM | POA: Diagnosis not present

## 2017-10-04 DIAGNOSIS — I480 Paroxysmal atrial fibrillation: Secondary | ICD-10-CM | POA: Diagnosis not present

## 2017-10-04 DIAGNOSIS — N139 Obstructive and reflux uropathy, unspecified: Secondary | ICD-10-CM | POA: Diagnosis not present

## 2017-10-04 DIAGNOSIS — I5032 Chronic diastolic (congestive) heart failure: Secondary | ICD-10-CM | POA: Diagnosis not present

## 2017-10-04 DIAGNOSIS — I10 Essential (primary) hypertension: Secondary | ICD-10-CM | POA: Diagnosis not present

## 2017-10-04 DIAGNOSIS — M6281 Muscle weakness (generalized): Secondary | ICD-10-CM | POA: Diagnosis not present

## 2017-10-04 DIAGNOSIS — J189 Pneumonia, unspecified organism: Secondary | ICD-10-CM | POA: Diagnosis not present

## 2017-10-04 DIAGNOSIS — N183 Chronic kidney disease, stage 3 (moderate): Secondary | ICD-10-CM | POA: Diagnosis not present

## 2017-10-04 DIAGNOSIS — I350 Nonrheumatic aortic (valve) stenosis: Secondary | ICD-10-CM | POA: Diagnosis not present

## 2017-10-04 DIAGNOSIS — R339 Retention of urine, unspecified: Secondary | ICD-10-CM | POA: Diagnosis not present

## 2017-10-04 DIAGNOSIS — R52 Pain, unspecified: Secondary | ICD-10-CM | POA: Diagnosis not present

## 2017-10-04 DIAGNOSIS — J9601 Acute respiratory failure with hypoxia: Secondary | ICD-10-CM | POA: Diagnosis not present

## 2017-10-10 DIAGNOSIS — R600 Localized edema: Secondary | ICD-10-CM | POA: Diagnosis not present

## 2017-10-10 DIAGNOSIS — I5032 Chronic diastolic (congestive) heart failure: Secondary | ICD-10-CM | POA: Diagnosis not present

## 2017-10-11 DIAGNOSIS — I10 Essential (primary) hypertension: Secondary | ICD-10-CM | POA: Diagnosis not present

## 2017-10-11 DIAGNOSIS — I480 Paroxysmal atrial fibrillation: Secondary | ICD-10-CM | POA: Diagnosis not present

## 2017-10-11 DIAGNOSIS — N183 Chronic kidney disease, stage 3 (moderate): Secondary | ICD-10-CM | POA: Diagnosis not present

## 2017-10-11 DIAGNOSIS — D500 Iron deficiency anemia secondary to blood loss (chronic): Secondary | ICD-10-CM | POA: Diagnosis not present

## 2017-10-11 DIAGNOSIS — J9601 Acute respiratory failure with hypoxia: Secondary | ICD-10-CM | POA: Diagnosis not present

## 2017-10-11 DIAGNOSIS — N139 Obstructive and reflux uropathy, unspecified: Secondary | ICD-10-CM | POA: Diagnosis not present

## 2017-10-11 DIAGNOSIS — I5032 Chronic diastolic (congestive) heart failure: Secondary | ICD-10-CM | POA: Diagnosis not present

## 2017-10-11 DIAGNOSIS — J189 Pneumonia, unspecified organism: Secondary | ICD-10-CM | POA: Diagnosis not present

## 2017-10-11 DIAGNOSIS — R339 Retention of urine, unspecified: Secondary | ICD-10-CM | POA: Diagnosis not present

## 2017-10-11 DIAGNOSIS — R52 Pain, unspecified: Secondary | ICD-10-CM | POA: Diagnosis not present

## 2017-10-11 DIAGNOSIS — I350 Nonrheumatic aortic (valve) stenosis: Secondary | ICD-10-CM | POA: Diagnosis not present

## 2017-10-11 DIAGNOSIS — M6281 Muscle weakness (generalized): Secondary | ICD-10-CM | POA: Diagnosis not present

## 2017-10-12 ENCOUNTER — Institutional Professional Consult (permissible substitution): Admitting: Cardiovascular Disease

## 2017-10-13 DIAGNOSIS — N183 Chronic kidney disease, stage 3 (moderate): Secondary | ICD-10-CM | POA: Diagnosis not present

## 2017-10-13 DIAGNOSIS — R52 Pain, unspecified: Secondary | ICD-10-CM | POA: Diagnosis not present

## 2017-10-13 DIAGNOSIS — J189 Pneumonia, unspecified organism: Secondary | ICD-10-CM | POA: Diagnosis not present

## 2017-10-13 DIAGNOSIS — N139 Obstructive and reflux uropathy, unspecified: Secondary | ICD-10-CM | POA: Diagnosis not present

## 2017-10-13 DIAGNOSIS — K922 Gastrointestinal hemorrhage, unspecified: Secondary | ICD-10-CM | POA: Diagnosis not present

## 2017-10-13 DIAGNOSIS — I10 Essential (primary) hypertension: Secondary | ICD-10-CM | POA: Diagnosis not present

## 2017-10-13 DIAGNOSIS — J9601 Acute respiratory failure with hypoxia: Secondary | ICD-10-CM | POA: Diagnosis not present

## 2017-10-13 DIAGNOSIS — D500 Iron deficiency anemia secondary to blood loss (chronic): Secondary | ICD-10-CM | POA: Diagnosis not present

## 2017-10-13 DIAGNOSIS — I5032 Chronic diastolic (congestive) heart failure: Secondary | ICD-10-CM | POA: Diagnosis not present

## 2017-10-13 DIAGNOSIS — I480 Paroxysmal atrial fibrillation: Secondary | ICD-10-CM | POA: Diagnosis not present

## 2017-10-13 DIAGNOSIS — R339 Retention of urine, unspecified: Secondary | ICD-10-CM | POA: Diagnosis not present

## 2017-10-13 DIAGNOSIS — M6281 Muscle weakness (generalized): Secondary | ICD-10-CM | POA: Diagnosis not present

## 2017-10-17 DIAGNOSIS — E785 Hyperlipidemia, unspecified: Secondary | ICD-10-CM | POA: Diagnosis not present

## 2017-10-17 DIAGNOSIS — E559 Vitamin D deficiency, unspecified: Secondary | ICD-10-CM | POA: Diagnosis not present

## 2017-10-17 DIAGNOSIS — I10 Essential (primary) hypertension: Secondary | ICD-10-CM | POA: Diagnosis not present

## 2017-10-17 DIAGNOSIS — D0339 Melanoma in situ of other parts of face: Secondary | ICD-10-CM | POA: Diagnosis not present

## 2017-10-17 DIAGNOSIS — E039 Hypothyroidism, unspecified: Secondary | ICD-10-CM | POA: Diagnosis not present

## 2017-10-17 DIAGNOSIS — K5900 Constipation, unspecified: Secondary | ICD-10-CM | POA: Diagnosis not present

## 2017-10-17 DIAGNOSIS — F419 Anxiety disorder, unspecified: Secondary | ICD-10-CM | POA: Diagnosis not present

## 2017-10-17 DIAGNOSIS — D649 Anemia, unspecified: Secondary | ICD-10-CM | POA: Diagnosis not present

## 2017-10-17 DIAGNOSIS — E7800 Pure hypercholesterolemia, unspecified: Secondary | ICD-10-CM | POA: Diagnosis not present

## 2017-10-17 DIAGNOSIS — F329 Major depressive disorder, single episode, unspecified: Secondary | ICD-10-CM | POA: Diagnosis not present

## 2017-10-17 DIAGNOSIS — R011 Cardiac murmur, unspecified: Secondary | ICD-10-CM | POA: Diagnosis not present

## 2017-10-17 DIAGNOSIS — N390 Urinary tract infection, site not specified: Secondary | ICD-10-CM | POA: Diagnosis not present

## 2017-10-17 DIAGNOSIS — Z09 Encounter for follow-up examination after completed treatment for conditions other than malignant neoplasm: Secondary | ICD-10-CM | POA: Diagnosis not present

## 2017-10-18 ENCOUNTER — Telehealth: Payer: Self-pay

## 2017-10-18 NOTE — Telephone Encounter (Signed)
I spoke with the pt to determine if she would like to reschedule TAVR Consult at this time.  The pt was scheduled to see Dr Burt Knack on 5/16 but cancelled this apt.  This apt was made while the pt was hospitalized and upon further review of the pt's hospital notes the pt had decided that she did not want to seek additional evaluation of Aortic Stenosis. I advised the pt to contact the office if she decides that she would like to seek additional evaluation and treatment for Aortic Stenosis.

## 2017-10-30 DIAGNOSIS — I5032 Chronic diastolic (congestive) heart failure: Secondary | ICD-10-CM | POA: Diagnosis not present

## 2017-10-30 DIAGNOSIS — I4891 Unspecified atrial fibrillation: Secondary | ICD-10-CM | POA: Diagnosis not present

## 2017-10-30 DIAGNOSIS — J9601 Acute respiratory failure with hypoxia: Secondary | ICD-10-CM | POA: Diagnosis not present

## 2017-10-30 DIAGNOSIS — D649 Anemia, unspecified: Secondary | ICD-10-CM | POA: Diagnosis not present

## 2017-10-30 DIAGNOSIS — F0391 Unspecified dementia with behavioral disturbance: Secondary | ICD-10-CM | POA: Diagnosis not present

## 2017-10-30 DIAGNOSIS — M6281 Muscle weakness (generalized): Secondary | ICD-10-CM | POA: Diagnosis not present

## 2017-10-30 DIAGNOSIS — K922 Gastrointestinal hemorrhage, unspecified: Secondary | ICD-10-CM | POA: Diagnosis not present

## 2017-10-30 DIAGNOSIS — I350 Nonrheumatic aortic (valve) stenosis: Secondary | ICD-10-CM | POA: Diagnosis not present

## 2017-10-30 DIAGNOSIS — R278 Other lack of coordination: Secondary | ICD-10-CM | POA: Diagnosis not present

## 2017-10-30 DIAGNOSIS — D509 Iron deficiency anemia, unspecified: Secondary | ICD-10-CM | POA: Diagnosis not present

## 2017-10-30 DIAGNOSIS — K219 Gastro-esophageal reflux disease without esophagitis: Secondary | ICD-10-CM | POA: Diagnosis not present

## 2017-10-30 DIAGNOSIS — F419 Anxiety disorder, unspecified: Secondary | ICD-10-CM | POA: Diagnosis not present

## 2017-10-30 DIAGNOSIS — N183 Chronic kidney disease, stage 3 (moderate): Secondary | ICD-10-CM | POA: Diagnosis not present

## 2017-10-30 DIAGNOSIS — E569 Vitamin deficiency, unspecified: Secondary | ICD-10-CM | POA: Diagnosis not present

## 2017-10-30 DIAGNOSIS — N139 Obstructive and reflux uropathy, unspecified: Secondary | ICD-10-CM | POA: Diagnosis not present

## 2017-10-31 DIAGNOSIS — I4891 Unspecified atrial fibrillation: Secondary | ICD-10-CM | POA: Diagnosis not present

## 2017-10-31 DIAGNOSIS — I5032 Chronic diastolic (congestive) heart failure: Secondary | ICD-10-CM | POA: Diagnosis not present

## 2017-10-31 DIAGNOSIS — K922 Gastrointestinal hemorrhage, unspecified: Secondary | ICD-10-CM | POA: Diagnosis not present

## 2017-10-31 DIAGNOSIS — I350 Nonrheumatic aortic (valve) stenosis: Secondary | ICD-10-CM | POA: Diagnosis not present

## 2017-10-31 DIAGNOSIS — N183 Chronic kidney disease, stage 3 (moderate): Secondary | ICD-10-CM | POA: Diagnosis not present

## 2017-10-31 DIAGNOSIS — M6281 Muscle weakness (generalized): Secondary | ICD-10-CM | POA: Diagnosis not present

## 2017-11-01 DIAGNOSIS — N183 Chronic kidney disease, stage 3 (moderate): Secondary | ICD-10-CM | POA: Diagnosis not present

## 2017-11-01 DIAGNOSIS — M6281 Muscle weakness (generalized): Secondary | ICD-10-CM | POA: Diagnosis not present

## 2017-11-01 DIAGNOSIS — I5032 Chronic diastolic (congestive) heart failure: Secondary | ICD-10-CM | POA: Diagnosis not present

## 2017-11-01 DIAGNOSIS — K922 Gastrointestinal hemorrhage, unspecified: Secondary | ICD-10-CM | POA: Diagnosis not present

## 2017-11-01 DIAGNOSIS — I4891 Unspecified atrial fibrillation: Secondary | ICD-10-CM | POA: Diagnosis not present

## 2017-11-01 DIAGNOSIS — I350 Nonrheumatic aortic (valve) stenosis: Secondary | ICD-10-CM | POA: Diagnosis not present

## 2017-11-02 DIAGNOSIS — M6281 Muscle weakness (generalized): Secondary | ICD-10-CM | POA: Diagnosis not present

## 2017-11-02 DIAGNOSIS — N183 Chronic kidney disease, stage 3 (moderate): Secondary | ICD-10-CM | POA: Diagnosis not present

## 2017-11-02 DIAGNOSIS — I4891 Unspecified atrial fibrillation: Secondary | ICD-10-CM | POA: Diagnosis not present

## 2017-11-02 DIAGNOSIS — I350 Nonrheumatic aortic (valve) stenosis: Secondary | ICD-10-CM | POA: Diagnosis not present

## 2017-11-02 DIAGNOSIS — K922 Gastrointestinal hemorrhage, unspecified: Secondary | ICD-10-CM | POA: Diagnosis not present

## 2017-11-02 DIAGNOSIS — I5032 Chronic diastolic (congestive) heart failure: Secondary | ICD-10-CM | POA: Diagnosis not present

## 2017-11-03 DIAGNOSIS — K922 Gastrointestinal hemorrhage, unspecified: Secondary | ICD-10-CM | POA: Diagnosis not present

## 2017-11-03 DIAGNOSIS — I4891 Unspecified atrial fibrillation: Secondary | ICD-10-CM | POA: Diagnosis not present

## 2017-11-03 DIAGNOSIS — I5032 Chronic diastolic (congestive) heart failure: Secondary | ICD-10-CM | POA: Diagnosis not present

## 2017-11-03 DIAGNOSIS — N183 Chronic kidney disease, stage 3 (moderate): Secondary | ICD-10-CM | POA: Diagnosis not present

## 2017-11-03 DIAGNOSIS — M6281 Muscle weakness (generalized): Secondary | ICD-10-CM | POA: Diagnosis not present

## 2017-11-03 DIAGNOSIS — I350 Nonrheumatic aortic (valve) stenosis: Secondary | ICD-10-CM | POA: Diagnosis not present

## 2017-11-06 DIAGNOSIS — I4891 Unspecified atrial fibrillation: Secondary | ICD-10-CM | POA: Diagnosis not present

## 2017-11-06 DIAGNOSIS — I350 Nonrheumatic aortic (valve) stenosis: Secondary | ICD-10-CM | POA: Diagnosis not present

## 2017-11-06 DIAGNOSIS — N183 Chronic kidney disease, stage 3 (moderate): Secondary | ICD-10-CM | POA: Diagnosis not present

## 2017-11-06 DIAGNOSIS — M6281 Muscle weakness (generalized): Secondary | ICD-10-CM | POA: Diagnosis not present

## 2017-11-06 DIAGNOSIS — I5032 Chronic diastolic (congestive) heart failure: Secondary | ICD-10-CM | POA: Diagnosis not present

## 2017-11-06 DIAGNOSIS — K922 Gastrointestinal hemorrhage, unspecified: Secondary | ICD-10-CM | POA: Diagnosis not present

## 2017-11-08 DIAGNOSIS — N183 Chronic kidney disease, stage 3 (moderate): Secondary | ICD-10-CM | POA: Diagnosis not present

## 2017-11-08 DIAGNOSIS — K922 Gastrointestinal hemorrhage, unspecified: Secondary | ICD-10-CM | POA: Diagnosis not present

## 2017-11-08 DIAGNOSIS — M6281 Muscle weakness (generalized): Secondary | ICD-10-CM | POA: Diagnosis not present

## 2017-11-08 DIAGNOSIS — I4891 Unspecified atrial fibrillation: Secondary | ICD-10-CM | POA: Diagnosis not present

## 2017-11-08 DIAGNOSIS — I5032 Chronic diastolic (congestive) heart failure: Secondary | ICD-10-CM | POA: Diagnosis not present

## 2017-11-08 DIAGNOSIS — I350 Nonrheumatic aortic (valve) stenosis: Secondary | ICD-10-CM | POA: Diagnosis not present

## 2017-11-10 DIAGNOSIS — M6281 Muscle weakness (generalized): Secondary | ICD-10-CM | POA: Diagnosis not present

## 2017-11-10 DIAGNOSIS — K922 Gastrointestinal hemorrhage, unspecified: Secondary | ICD-10-CM | POA: Diagnosis not present

## 2017-11-10 DIAGNOSIS — N183 Chronic kidney disease, stage 3 (moderate): Secondary | ICD-10-CM | POA: Diagnosis not present

## 2017-11-10 DIAGNOSIS — I5032 Chronic diastolic (congestive) heart failure: Secondary | ICD-10-CM | POA: Diagnosis not present

## 2017-11-10 DIAGNOSIS — I4891 Unspecified atrial fibrillation: Secondary | ICD-10-CM | POA: Diagnosis not present

## 2017-11-10 DIAGNOSIS — I350 Nonrheumatic aortic (valve) stenosis: Secondary | ICD-10-CM | POA: Diagnosis not present

## 2017-11-11 DIAGNOSIS — I350 Nonrheumatic aortic (valve) stenosis: Secondary | ICD-10-CM | POA: Diagnosis not present

## 2017-11-11 DIAGNOSIS — K922 Gastrointestinal hemorrhage, unspecified: Secondary | ICD-10-CM | POA: Diagnosis not present

## 2017-11-11 DIAGNOSIS — I4891 Unspecified atrial fibrillation: Secondary | ICD-10-CM | POA: Diagnosis not present

## 2017-11-11 DIAGNOSIS — N183 Chronic kidney disease, stage 3 (moderate): Secondary | ICD-10-CM | POA: Diagnosis not present

## 2017-11-11 DIAGNOSIS — M6281 Muscle weakness (generalized): Secondary | ICD-10-CM | POA: Diagnosis not present

## 2017-11-11 DIAGNOSIS — I5032 Chronic diastolic (congestive) heart failure: Secondary | ICD-10-CM | POA: Diagnosis not present

## 2017-11-13 DIAGNOSIS — I5032 Chronic diastolic (congestive) heart failure: Secondary | ICD-10-CM | POA: Diagnosis not present

## 2017-11-13 DIAGNOSIS — N183 Chronic kidney disease, stage 3 (moderate): Secondary | ICD-10-CM | POA: Diagnosis not present

## 2017-11-13 DIAGNOSIS — M6281 Muscle weakness (generalized): Secondary | ICD-10-CM | POA: Diagnosis not present

## 2017-11-13 DIAGNOSIS — I350 Nonrheumatic aortic (valve) stenosis: Secondary | ICD-10-CM | POA: Diagnosis not present

## 2017-11-13 DIAGNOSIS — I4891 Unspecified atrial fibrillation: Secondary | ICD-10-CM | POA: Diagnosis not present

## 2017-11-13 DIAGNOSIS — K922 Gastrointestinal hemorrhage, unspecified: Secondary | ICD-10-CM | POA: Diagnosis not present

## 2017-11-15 DIAGNOSIS — I350 Nonrheumatic aortic (valve) stenosis: Secondary | ICD-10-CM | POA: Diagnosis not present

## 2017-11-15 DIAGNOSIS — K922 Gastrointestinal hemorrhage, unspecified: Secondary | ICD-10-CM | POA: Diagnosis not present

## 2017-11-15 DIAGNOSIS — N183 Chronic kidney disease, stage 3 (moderate): Secondary | ICD-10-CM | POA: Diagnosis not present

## 2017-11-15 DIAGNOSIS — M6281 Muscle weakness (generalized): Secondary | ICD-10-CM | POA: Diagnosis not present

## 2017-11-15 DIAGNOSIS — I5032 Chronic diastolic (congestive) heart failure: Secondary | ICD-10-CM | POA: Diagnosis not present

## 2017-11-15 DIAGNOSIS — I4891 Unspecified atrial fibrillation: Secondary | ICD-10-CM | POA: Diagnosis not present

## 2017-11-16 DIAGNOSIS — I4891 Unspecified atrial fibrillation: Secondary | ICD-10-CM | POA: Diagnosis not present

## 2017-11-16 DIAGNOSIS — K922 Gastrointestinal hemorrhage, unspecified: Secondary | ICD-10-CM | POA: Diagnosis not present

## 2017-11-16 DIAGNOSIS — M6281 Muscle weakness (generalized): Secondary | ICD-10-CM | POA: Diagnosis not present

## 2017-11-16 DIAGNOSIS — I350 Nonrheumatic aortic (valve) stenosis: Secondary | ICD-10-CM | POA: Diagnosis not present

## 2017-11-16 DIAGNOSIS — N183 Chronic kidney disease, stage 3 (moderate): Secondary | ICD-10-CM | POA: Diagnosis not present

## 2017-11-16 DIAGNOSIS — I5032 Chronic diastolic (congestive) heart failure: Secondary | ICD-10-CM | POA: Diagnosis not present

## 2017-11-17 DIAGNOSIS — K922 Gastrointestinal hemorrhage, unspecified: Secondary | ICD-10-CM | POA: Diagnosis not present

## 2017-11-17 DIAGNOSIS — N183 Chronic kidney disease, stage 3 (moderate): Secondary | ICD-10-CM | POA: Diagnosis not present

## 2017-11-17 DIAGNOSIS — I350 Nonrheumatic aortic (valve) stenosis: Secondary | ICD-10-CM | POA: Diagnosis not present

## 2017-11-17 DIAGNOSIS — I5032 Chronic diastolic (congestive) heart failure: Secondary | ICD-10-CM | POA: Diagnosis not present

## 2017-11-17 DIAGNOSIS — M6281 Muscle weakness (generalized): Secondary | ICD-10-CM | POA: Diagnosis not present

## 2017-11-17 DIAGNOSIS — I4891 Unspecified atrial fibrillation: Secondary | ICD-10-CM | POA: Diagnosis not present

## 2017-11-18 DIAGNOSIS — M6281 Muscle weakness (generalized): Secondary | ICD-10-CM | POA: Diagnosis not present

## 2017-11-18 DIAGNOSIS — I4891 Unspecified atrial fibrillation: Secondary | ICD-10-CM | POA: Diagnosis not present

## 2017-11-18 DIAGNOSIS — I350 Nonrheumatic aortic (valve) stenosis: Secondary | ICD-10-CM | POA: Diagnosis not present

## 2017-11-18 DIAGNOSIS — K922 Gastrointestinal hemorrhage, unspecified: Secondary | ICD-10-CM | POA: Diagnosis not present

## 2017-11-18 DIAGNOSIS — N183 Chronic kidney disease, stage 3 (moderate): Secondary | ICD-10-CM | POA: Diagnosis not present

## 2017-11-18 DIAGNOSIS — I5032 Chronic diastolic (congestive) heart failure: Secondary | ICD-10-CM | POA: Diagnosis not present

## 2017-11-19 DIAGNOSIS — I5032 Chronic diastolic (congestive) heart failure: Secondary | ICD-10-CM | POA: Diagnosis not present

## 2017-11-19 DIAGNOSIS — K922 Gastrointestinal hemorrhage, unspecified: Secondary | ICD-10-CM | POA: Diagnosis not present

## 2017-11-19 DIAGNOSIS — I350 Nonrheumatic aortic (valve) stenosis: Secondary | ICD-10-CM | POA: Diagnosis not present

## 2017-11-19 DIAGNOSIS — N183 Chronic kidney disease, stage 3 (moderate): Secondary | ICD-10-CM | POA: Diagnosis not present

## 2017-11-19 DIAGNOSIS — I4891 Unspecified atrial fibrillation: Secondary | ICD-10-CM | POA: Diagnosis not present

## 2017-11-19 DIAGNOSIS — M6281 Muscle weakness (generalized): Secondary | ICD-10-CM | POA: Diagnosis not present

## 2017-11-27 DEATH — deceased

## 2019-02-04 IMAGING — DX DG CHEST 1V PORT
1 series · 1 of 1 positions shown · non-contrast
Comparison: Radiograph September 17, 2017.

CLINICAL DATA: Shortness of breath.

EXAM:
PORTABLE CHEST 1 VIEW

[chest ap]
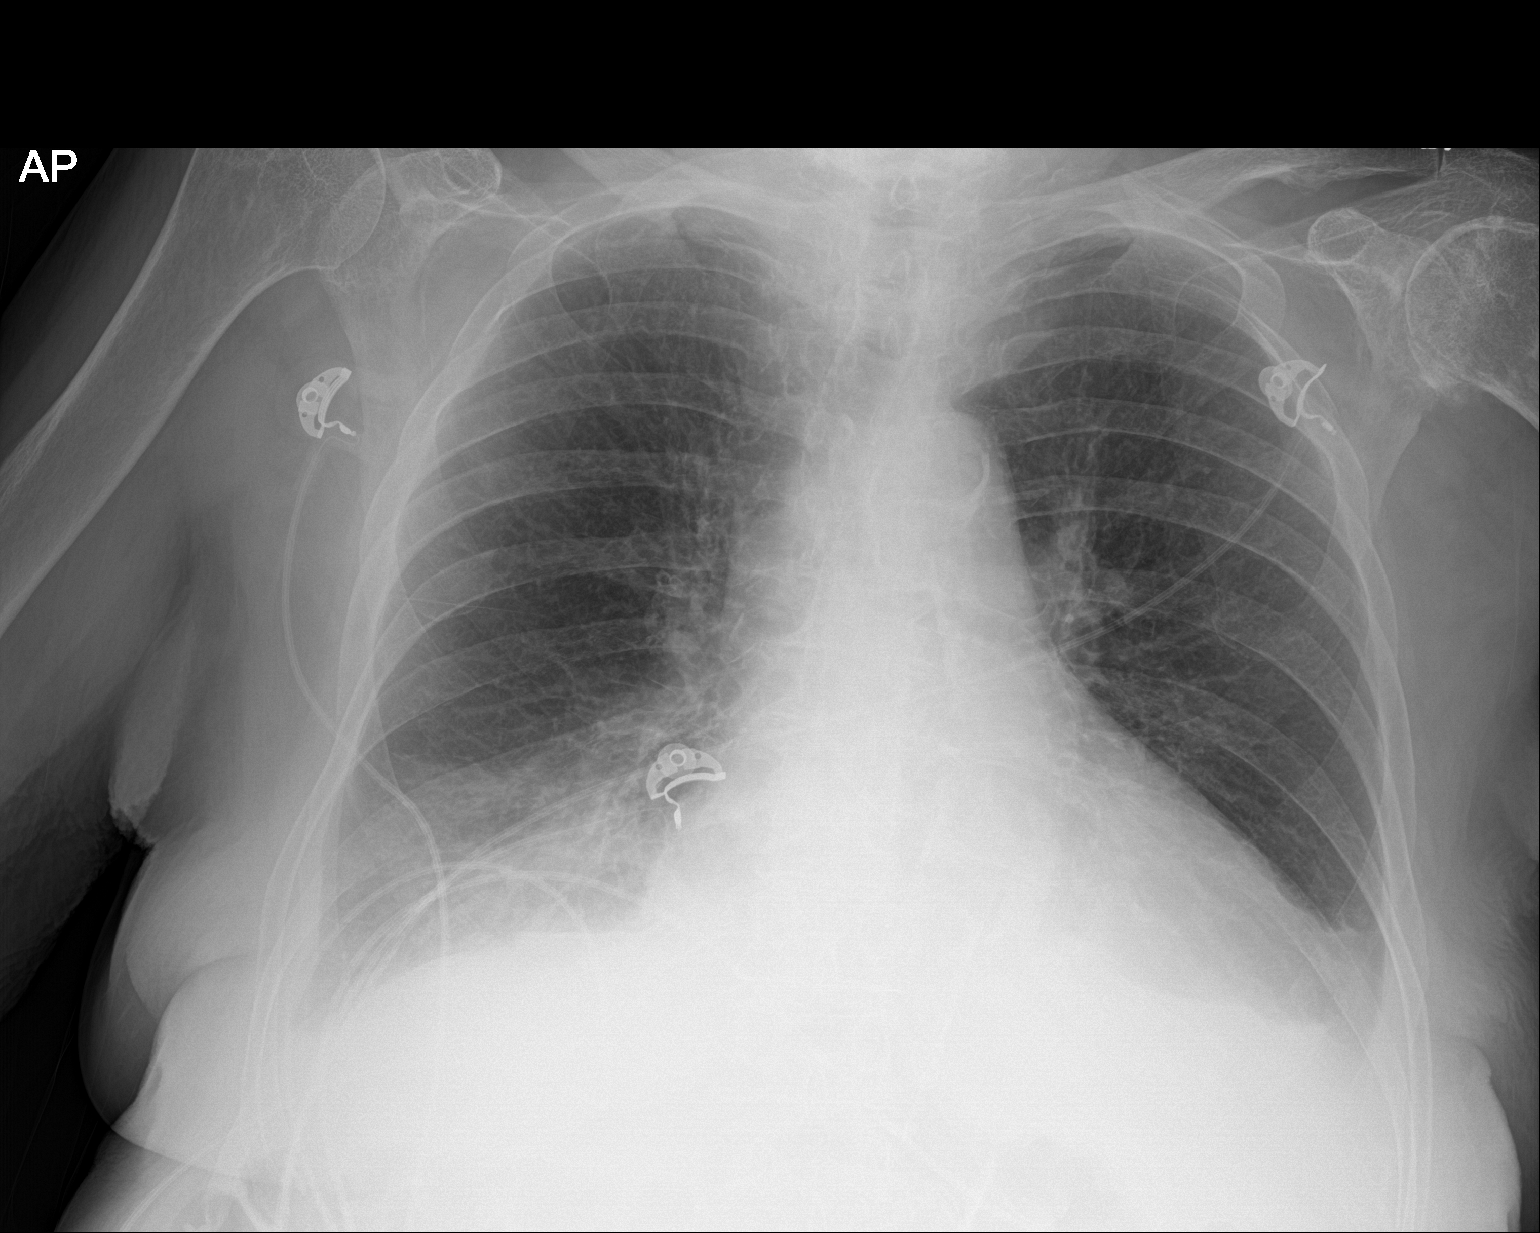

[1 of 1 positions shown; findings below may reference images not displayed]

FINDINGS: Stable cardiomediastinal silhouette. Atherosclerosis of thoracic
aorta is noted. No pneumothorax is noted. Mild bibasilar
subsegmental atelectasis and pleural effusions are noted. Bony
thorax is unremarkable.
IMPRESSION: Mild bibasilar subsegmental atelectasis and pleural effusions.

Aortic Atherosclerosis ([id number]-V57.7).

## 2019-02-08 IMAGING — DX DG CHEST 1V PORT
1 series · 1 of 1 positions shown · non-contrast
Comparison: Portable chest x-ray of 09/18/2017

CLINICAL DATA: Shortness of breath, history of CHF and atrial
fibrillation

EXAM:
PORTABLE CHEST 1 VIEW

[chest ap]
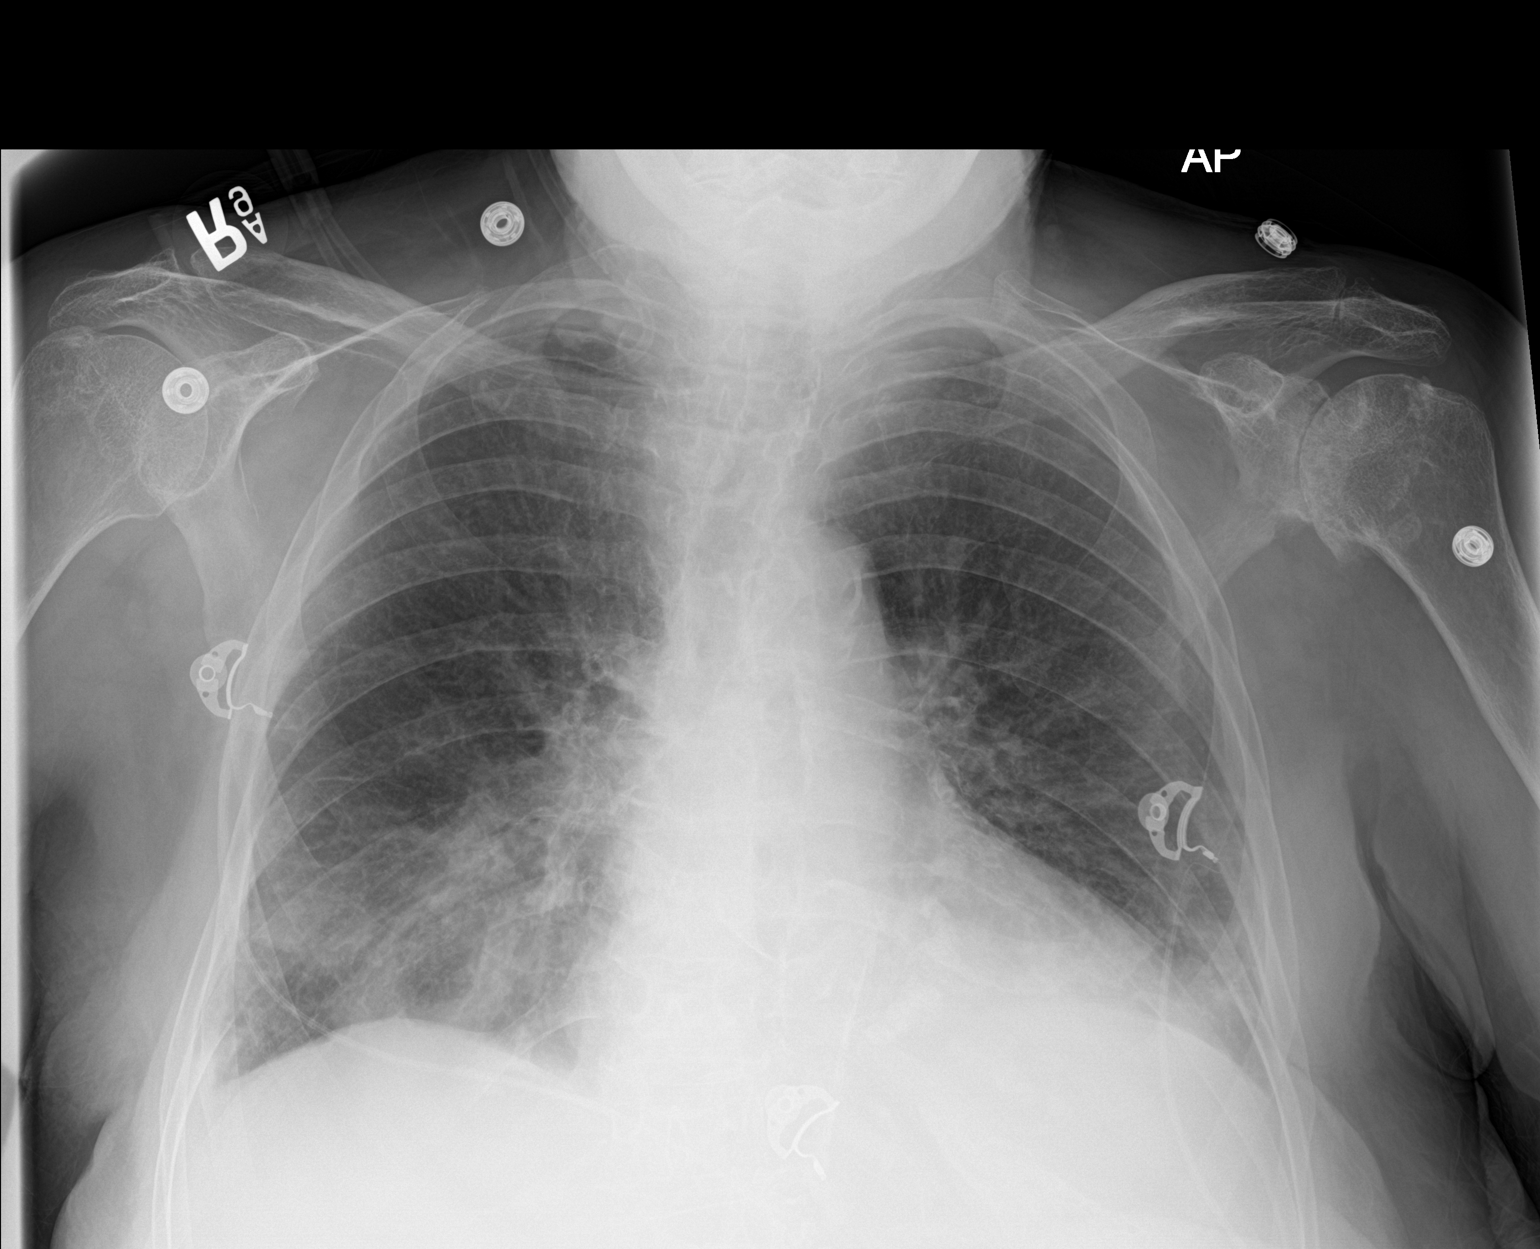

[1 of 1 positions shown; findings below may reference images not displayed]

FINDINGS: There is little change in airspace disease at both lung bases. This
appears more than would be expected with just atelectasis, and
bibasilar pneumonia is a consideration. Small pleural effusions
cannot be excluded. Mild cardiomegaly is stable. Moderate changes of
thoracic aortic atherosclerosis are noted. Significant degenerative
changes present within the left shoulder.
IMPRESSION: 1. Persistent bibasilar airspace disease suspicious for pneumonia.
Small effusions and mild atelectasis may also be present.
2. Stable cardiomegaly.
3. Significant degenerative change in the left shoulder.

## 2019-02-11 IMAGING — DX DG CHEST 2V
2 series · 2 of 2 positions shown · non-contrast
Comparison: September 22, 2017

CLINICAL DATA: Low blood pressure last night.  Cough for 1 week.

EXAM:
CHEST - 2 VIEW

[chest lat]
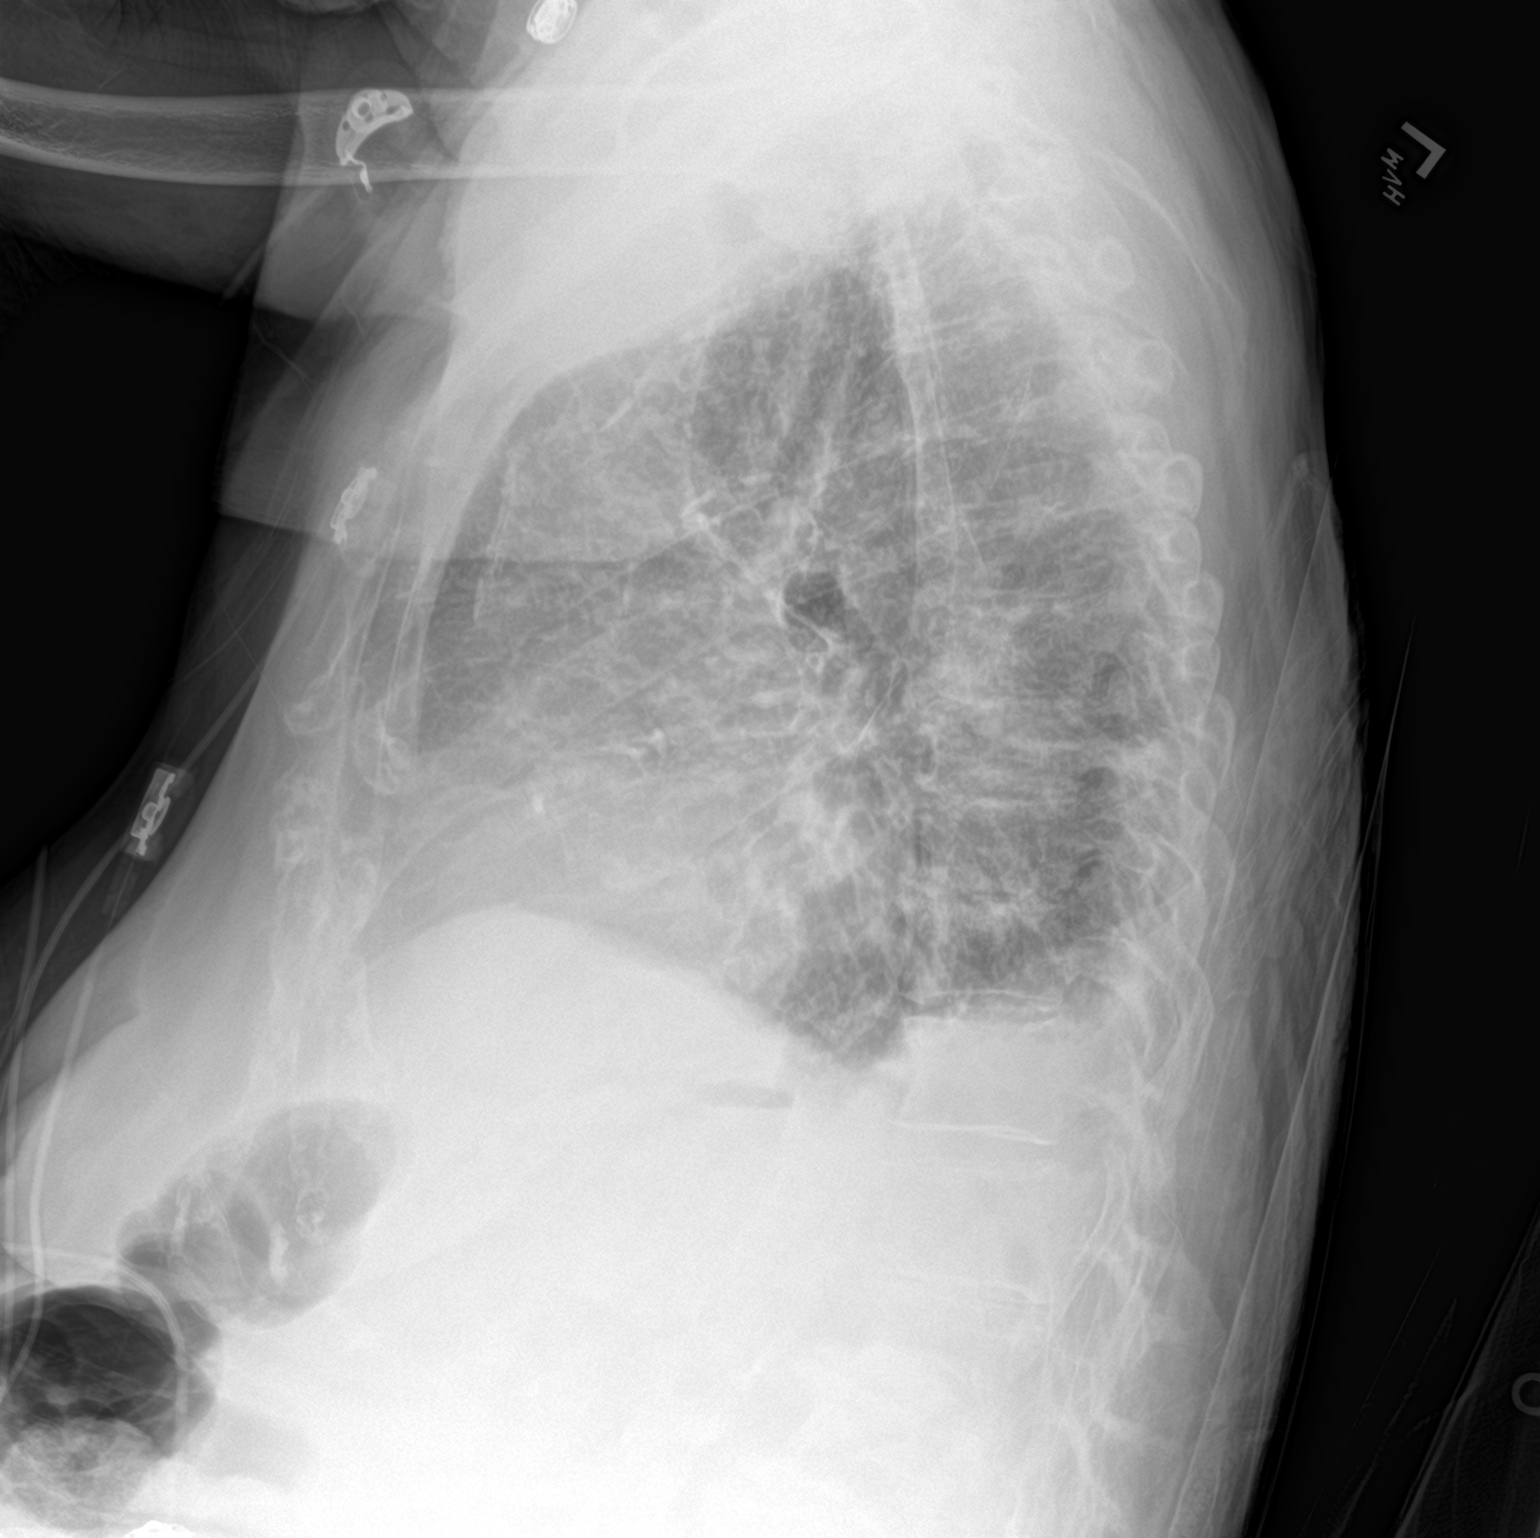

[chest ap]
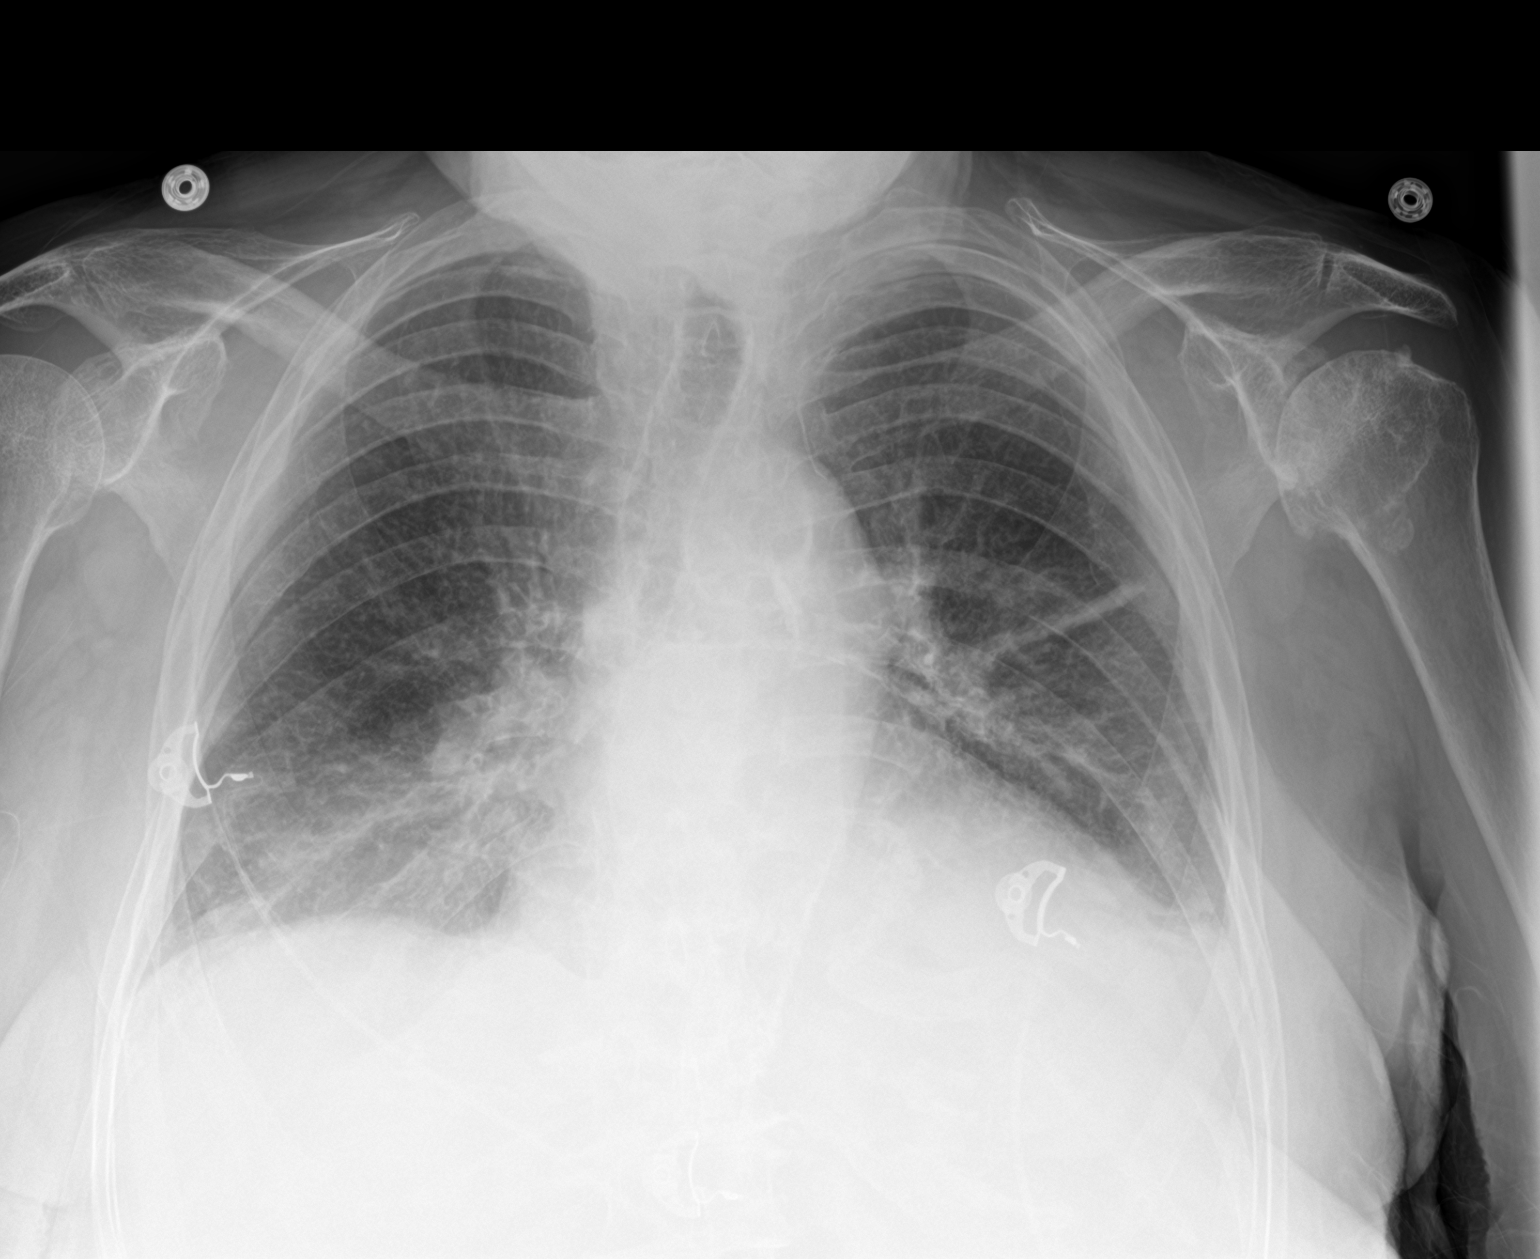

[2 of 2 positions shown; findings below may reference images not displayed]

FINDINGS: No pneumothorax. Stable cardiomegaly. The hila and mediastinum are
unchanged. Persistent opacities in the bases, mildly more prominent
the interval. Atelectasis in the left mid lung. Small effusions.
Wedging of an upper lumbar vertebral body. This is stable.
IMPRESSION: Increasing small effusions and underlying opacities. Mild pulmonary
venous congestion cough, also more prominent.
